# Patient Record
Sex: Male | Born: 1945 | Race: White | Hispanic: No | Smoking: Former smoker
Health system: Southern US, Community
[De-identification: ages and names within clinical notes are randomized; demographics above are authoritative.]

## PROBLEM LIST (undated history)

## (undated) DIAGNOSIS — R7303 Prediabetes: Secondary | ICD-10-CM

## (undated) DIAGNOSIS — F419 Anxiety disorder, unspecified: Secondary | ICD-10-CM

## (undated) DIAGNOSIS — I10 Essential (primary) hypertension: Secondary | ICD-10-CM

## (undated) DIAGNOSIS — C801 Malignant (primary) neoplasm, unspecified: Secondary | ICD-10-CM

## (undated) DIAGNOSIS — G4730 Sleep apnea, unspecified: Secondary | ICD-10-CM

## (undated) DIAGNOSIS — E785 Hyperlipidemia, unspecified: Secondary | ICD-10-CM

## (undated) DIAGNOSIS — F32A Depression, unspecified: Secondary | ICD-10-CM

## (undated) HISTORY — PX: CHOLECYSTECTOMY: SHX55

## (undated) HISTORY — DX: Anxiety disorder, unspecified: F41.9

## (undated) HISTORY — PX: KNEE SURGERY: SHX244

## (undated) HISTORY — PX: EYE SURGERY: SHX253

## (undated) HISTORY — PX: COLON SURGERY: SHX602

## (undated) HISTORY — PX: SHOULDER SURGERY: SHX246

## (undated) HISTORY — DX: Hyperlipidemia, unspecified: E78.5

## (undated) HISTORY — PX: OTHER SURGICAL HISTORY: SHX169

## (undated) HISTORY — DX: Essential (primary) hypertension: I10

---

## 1997-12-08 ENCOUNTER — Ambulatory Visit (HOSPITAL_COMMUNITY): Admission: RE | Admit: 1997-12-08 | Discharge: 1997-12-08 | Payer: Self-pay | Admitting: Gastroenterology

## 1998-01-31 ENCOUNTER — Inpatient Hospital Stay (HOSPITAL_COMMUNITY): Admission: AD | Admit: 1998-01-31 | Discharge: 1998-02-01 | Payer: Self-pay | Admitting: *Deleted

## 1998-06-23 ENCOUNTER — Ambulatory Visit (HOSPITAL_COMMUNITY): Admission: RE | Admit: 1998-06-23 | Discharge: 1998-06-23 | Payer: Self-pay | Admitting: Gastroenterology

## 2000-07-31 ENCOUNTER — Ambulatory Visit (HOSPITAL_COMMUNITY): Admission: RE | Admit: 2000-07-31 | Discharge: 2000-07-31 | Payer: Self-pay | Admitting: Gastroenterology

## 2003-11-26 ENCOUNTER — Emergency Department (HOSPITAL_COMMUNITY): Admission: EM | Admit: 2003-11-26 | Discharge: 2003-11-26 | Payer: Self-pay | Admitting: Emergency Medicine

## 2004-02-02 ENCOUNTER — Ambulatory Visit (HOSPITAL_COMMUNITY): Admission: RE | Admit: 2004-02-02 | Discharge: 2004-02-02 | Payer: Self-pay | Admitting: Gastroenterology

## 2004-02-02 ENCOUNTER — Encounter (INDEPENDENT_AMBULATORY_CARE_PROVIDER_SITE_OTHER): Payer: Self-pay | Admitting: *Deleted

## 2004-03-16 ENCOUNTER — Encounter (INDEPENDENT_AMBULATORY_CARE_PROVIDER_SITE_OTHER): Payer: Self-pay | Admitting: Specialist

## 2004-03-16 ENCOUNTER — Ambulatory Visit (HOSPITAL_COMMUNITY): Admission: RE | Admit: 2004-03-16 | Discharge: 2004-03-16 | Payer: Self-pay | Admitting: Gastroenterology

## 2004-04-27 ENCOUNTER — Encounter (INDEPENDENT_AMBULATORY_CARE_PROVIDER_SITE_OTHER): Payer: Self-pay | Admitting: *Deleted

## 2004-04-27 ENCOUNTER — Ambulatory Visit (HOSPITAL_COMMUNITY): Admission: RE | Admit: 2004-04-27 | Discharge: 2004-04-27 | Payer: Self-pay | Admitting: Gastroenterology

## 2004-04-29 ENCOUNTER — Encounter: Admission: RE | Admit: 2004-04-29 | Discharge: 2004-04-29 | Payer: Self-pay | Admitting: General Surgery

## 2004-06-01 ENCOUNTER — Inpatient Hospital Stay (HOSPITAL_COMMUNITY): Admission: RE | Admit: 2004-06-01 | Discharge: 2004-06-06 | Payer: Self-pay | Admitting: General Surgery

## 2004-06-01 ENCOUNTER — Encounter (INDEPENDENT_AMBULATORY_CARE_PROVIDER_SITE_OTHER): Payer: Self-pay | Admitting: Specialist

## 2004-06-13 ENCOUNTER — Ambulatory Visit: Payer: Self-pay | Admitting: Hematology and Oncology

## 2004-06-16 ENCOUNTER — Ambulatory Visit (HOSPITAL_COMMUNITY): Admission: RE | Admit: 2004-06-16 | Discharge: 2004-06-16 | Payer: Self-pay | Admitting: Hematology and Oncology

## 2005-02-06 ENCOUNTER — Ambulatory Visit: Payer: Self-pay | Admitting: Hematology and Oncology

## 2005-02-20 ENCOUNTER — Ambulatory Visit (HOSPITAL_BASED_OUTPATIENT_CLINIC_OR_DEPARTMENT_OTHER): Admission: RE | Admit: 2005-02-20 | Discharge: 2005-02-20 | Payer: Self-pay | Admitting: Family Medicine

## 2005-03-02 ENCOUNTER — Encounter (INDEPENDENT_AMBULATORY_CARE_PROVIDER_SITE_OTHER): Payer: Self-pay | Admitting: *Deleted

## 2005-03-02 ENCOUNTER — Ambulatory Visit (HOSPITAL_COMMUNITY): Admission: RE | Admit: 2005-03-02 | Discharge: 2005-03-02 | Payer: Self-pay | Admitting: Gastroenterology

## 2005-03-05 ENCOUNTER — Ambulatory Visit: Payer: Self-pay | Admitting: Internal Medicine

## 2005-03-22 ENCOUNTER — Ambulatory Visit (HOSPITAL_BASED_OUTPATIENT_CLINIC_OR_DEPARTMENT_OTHER): Admission: RE | Admit: 2005-03-22 | Discharge: 2005-03-22 | Payer: Self-pay | Admitting: Family Medicine

## 2005-03-26 ENCOUNTER — Ambulatory Visit: Payer: Self-pay | Admitting: Internal Medicine

## 2005-06-23 ENCOUNTER — Encounter (INDEPENDENT_AMBULATORY_CARE_PROVIDER_SITE_OTHER): Payer: Self-pay | Admitting: Specialist

## 2005-06-23 ENCOUNTER — Ambulatory Visit (HOSPITAL_COMMUNITY): Admission: RE | Admit: 2005-06-23 | Discharge: 2005-06-23 | Payer: Self-pay | Admitting: Gastroenterology

## 2005-09-18 ENCOUNTER — Ambulatory Visit: Payer: Self-pay | Admitting: Hematology and Oncology

## 2005-11-02 ENCOUNTER — Encounter: Admission: RE | Admit: 2005-11-02 | Discharge: 2005-11-02 | Payer: Self-pay | Admitting: General Surgery

## 2006-10-12 ENCOUNTER — Encounter: Admission: RE | Admit: 2006-10-12 | Discharge: 2006-10-12 | Payer: Self-pay | Admitting: Orthopaedic Surgery

## 2007-05-17 ENCOUNTER — Ambulatory Visit: Payer: Self-pay | Admitting: Hematology and Oncology

## 2007-05-17 LAB — CBC WITH DIFFERENTIAL/PLATELET
BASO%: 1 % (ref 0.0–2.0)
Basophils Absolute: 0.1 10*3/uL (ref 0.0–0.1)
EOS%: 1.1 % (ref 0.0–7.0)
Eosinophils Absolute: 0.1 10*3/uL (ref 0.0–0.5)
HCT: 47.2 % (ref 38.7–49.9)
HGB: 16.3 g/dL (ref 13.0–17.1)
LYMPH%: 19.6 % (ref 14.0–48.0)
MCH: 30.7 pg (ref 28.0–33.4)
MCHC: 34.6 g/dL (ref 32.0–35.9)
MCV: 88.9 fL (ref 81.6–98.0)
MONO#: 0.5 10*3/uL (ref 0.1–0.9)
MONO%: 6 % (ref 0.0–13.0)
NEUT#: 5.9 10*3/uL (ref 1.5–6.5)
NEUT%: 72.3 % (ref 40.0–75.0)
Platelets: 306 10*3/uL (ref 145–400)
RBC: 5.32 10*6/uL (ref 4.20–5.71)
RDW: 14.3 % (ref 11.2–14.6)
WBC: 8.1 10*3/uL (ref 4.0–10.0)
lymph#: 1.6 10*3/uL (ref 0.9–3.3)

## 2007-05-17 LAB — COMPREHENSIVE METABOLIC PANEL
ALT: 29 U/L (ref 0–53)
AST: 24 U/L (ref 0–37)
Albumin: 4 g/dL (ref 3.5–5.2)
CO2: 32 mEq/L (ref 19–32)
Calcium: 10.4 mg/dL (ref 8.4–10.5)
Chloride: 98 mEq/L (ref 96–112)
Potassium: 3.9 mEq/L (ref 3.5–5.3)
Sodium: 138 mEq/L (ref 135–145)
Total Protein: 7.1 g/dL (ref 6.0–8.3)

## 2007-05-17 LAB — CEA: CEA: <0.5 ng/mL (ref 0.0–5.0)

## 2007-12-11 ENCOUNTER — Ambulatory Visit (HOSPITAL_COMMUNITY): Admission: RE | Admit: 2007-12-11 | Discharge: 2007-12-11 | Payer: Self-pay | Admitting: Hematology and Oncology

## 2008-05-18 ENCOUNTER — Ambulatory Visit: Payer: Self-pay | Admitting: Hematology and Oncology

## 2008-05-20 LAB — CBC WITH DIFFERENTIAL/PLATELET
BASO%: 0.4 % (ref 0.0–2.0)
Basophils Absolute: 0 10*3/uL (ref 0.0–0.1)
EOS%: 2.3 % (ref 0.0–7.0)
Eosinophils Absolute: 0.2 10*3/uL (ref 0.0–0.5)
HCT: 43.7 % (ref 38.7–49.9)
HGB: 15.3 g/dL (ref 13.0–17.1)
LYMPH%: 26.5 % (ref 14.0–48.0)
MCH: 31.6 pg (ref 28.0–33.4)
MCHC: 35.1 g/dL (ref 32.0–35.9)
MCV: 90.1 fL (ref 81.6–98.0)
MONO#: 0.8 10*3/uL (ref 0.1–0.9)
MONO%: 9.9 % (ref 0.0–13.0)
NEUT#: 4.7 10*3/uL (ref 1.5–6.5)
NEUT%: 60.9 % (ref 40.0–75.0)
Platelets: 263 10*3/uL (ref 145–400)
RBC: 4.85 10*6/uL (ref 4.20–5.71)
RDW: 13.2 % (ref 11.2–14.6)
WBC: 7.8 10*3/uL (ref 4.0–10.0)
lymph#: 2.1 10*3/uL (ref 0.9–3.3)

## 2008-05-20 LAB — COMPREHENSIVE METABOLIC PANEL
ALT: 18 U/L (ref 0–53)
AST: 15 U/L (ref 0–37)
Albumin: 4.5 g/dL (ref 3.5–5.2)
Alkaline Phosphatase: 42 U/L (ref 39–117)
BUN: 15 mg/dL (ref 6–23)
CO2: 31 mEq/L (ref 19–32)
Calcium: 9.8 mg/dL (ref 8.4–10.5)
Chloride: 103 mEq/L (ref 96–112)
Creatinine, Ser: 1.02 mg/dL (ref 0.40–1.50)
Glucose, Bld: 73 mg/dL (ref 70–99)
Potassium: 4.1 mEq/L (ref 3.5–5.3)
Sodium: 138 mEq/L (ref 135–145)
Total Bilirubin: 0.4 mg/dL (ref 0.3–1.2)
Total Protein: 6.5 g/dL (ref 6.0–8.3)

## 2008-05-20 LAB — CEA: CEA: 1 ng/mL (ref 0.0–5.0)

## 2008-06-18 ENCOUNTER — Ambulatory Visit (HOSPITAL_COMMUNITY): Admission: RE | Admit: 2008-06-18 | Discharge: 2008-06-18 | Payer: Self-pay | Admitting: Hematology and Oncology

## 2008-07-02 ENCOUNTER — Encounter: Admission: RE | Admit: 2008-07-02 | Discharge: 2008-07-02 | Payer: Self-pay | Admitting: Family Medicine

## 2009-05-19 ENCOUNTER — Ambulatory Visit: Payer: Self-pay | Admitting: Hematology and Oncology

## 2009-05-21 ENCOUNTER — Ambulatory Visit (HOSPITAL_COMMUNITY): Admission: RE | Admit: 2009-05-21 | Discharge: 2009-05-21 | Payer: Self-pay | Admitting: Hematology and Oncology

## 2009-05-21 LAB — CBC WITH DIFFERENTIAL/PLATELET
BASO%: 0.7 % (ref 0.0–2.0)
Basophils Absolute: 0.1 10*3/uL (ref 0.0–0.1)
EOS%: 2.5 % (ref 0.0–7.0)
Eosinophils Absolute: 0.2 10*3/uL (ref 0.0–0.5)
HCT: 42.1 % (ref 38.4–49.9)
HGB: 14.7 g/dL (ref 13.0–17.1)
LYMPH%: 24.2 % (ref 14.0–49.0)
MCH: 30.4 pg (ref 27.2–33.4)
MCHC: 34.9 g/dL (ref 32.0–36.0)
MCV: 87.2 fL (ref 79.3–98.0)
MONO#: 0.9 10*3/uL (ref 0.1–0.9)
MONO%: 12 % (ref 0.0–14.0)
NEUT#: 4.5 10*3/uL (ref 1.5–6.5)
NEUT%: 60.6 % (ref 39.0–75.0)
Platelets: 247 10*3/uL (ref 140–400)
RBC: 4.83 10*6/uL (ref 4.20–5.82)
RDW: 13.6 % (ref 11.0–14.6)
WBC: 7.5 10*3/uL (ref 4.0–10.3)
lymph#: 1.8 10*3/uL (ref 0.9–3.3)

## 2009-05-21 LAB — COMPREHENSIVE METABOLIC PANEL
ALT: 34 U/L (ref 0–53)
AST: 22 U/L (ref 0–37)
Albumin: 4.5 g/dL (ref 3.5–5.2)
Alkaline Phosphatase: 53 U/L (ref 39–117)
BUN: 21 mg/dL (ref 6–23)
CO2: 22 mEq/L (ref 19–32)
Calcium: 9.2 mg/dL (ref 8.4–10.5)
Chloride: 104 mEq/L (ref 96–112)
Creatinine, Ser: 0.95 mg/dL (ref 0.40–1.50)
Glucose, Bld: 119 mg/dL — ABNORMAL HIGH (ref 70–99)
Potassium: 4 mEq/L (ref 3.5–5.3)
Sodium: 139 mEq/L (ref 135–145)
Total Bilirubin: 0.4 mg/dL (ref 0.3–1.2)
Total Protein: 6.8 g/dL (ref 6.0–8.3)

## 2009-05-21 LAB — CEA: CEA: 0.9 ng/mL (ref 0.0–5.0)

## 2010-05-29 ENCOUNTER — Encounter: Payer: Self-pay | Admitting: Hematology and Oncology

## 2010-05-29 ENCOUNTER — Encounter: Payer: Self-pay | Admitting: Family Medicine

## 2010-05-30 ENCOUNTER — Encounter: Payer: Self-pay | Admitting: Hematology and Oncology

## 2010-09-23 NOTE — Op Note (Signed)
[name], [name]                ACCOUNT NO.:  [id number]   MEDICAL RECORD NO.:  [id number]          PATIENT TYPE:  AMB   LOCATION:  ENDO                         FACILITY:  [facility]   PHYSICIAN:  John C. Madilyn Fireman, M.D.    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  Procedure is for surveillance of his neoplasm.   PROCEDURE:  The patient was placed in the left lateral decubitus position  and placed on the pulse monitor with continuous low-flow oxygen delivered by  nasal cannula. He was sedated with 80 mcg IV fentanyl, 9 milligrams IV  Versed. Olympus video colonoscope was inserted into the rectum, advanced to  cecum, confirmed by transillumination of McBurney's point and visualization  of ileocecal valve and appendiceal orifice. Prep was good. The cecum and  ascending colon appeared normal. In what was estimated to be the proximal  transverse colon there was an oblong approximately 2.5 x 1.5 cm semisessile  polyp that was slightly tethered and on a haustral fold.  Two or three  biopsies of this and further inspection felt that it could possibly be  removable endoscopically. We used the snare to cut through the lesion in two  fairly large fragments. It was not clear whether all residual polyp was  removed but all the biopsy specimens were sent in the same specimen  container to rule out malignancy. The remainder of the transverse colon  appeared normal. There was a fairly complex-looking surgical anastomosis at  approximately 60 cm with suture material seen and no evidence of neoplasia  or visible granulation tissue. The descending, sigmoid, and rectum distal to  this appeared normal with exception of few scattered sigmoid diverticula.  Scope was then withdrawn and the  patient returned to the recovery room in  stable condition. He tolerated the procedure well.  There were no immediate  complications.   IMPRESSION:  1.  Transverse colon polypoid mass.  2.  Surgical anastomosis of approximately 60 cm.   PLAN:  Await histology.  It is unclear whether this lesion will require  additional surgery or may be amenable to endoscopic obliteration but in the  latter case certainly need close endoscopic followup.           ______________________________  Everardo All. Madilyn Fireman, M.D.     JCH/MEDQ  D:  03/02/2005  T:  03/02/2005  Job:  [id number]   cc:   Dr. Barrie Lyme in oncology   Donia Guiles, M.D.  Fax: [phone]   Ollen Gross. Vernell Morgans, M.D.  [address], [address]  [place]  [place] [address]

## 2010-09-23 NOTE — Op Note (Signed)
[name], [name]                ACCOUNT NO.:  [id number]   MEDICAL RECORD NO.:  [id number]          PATIENT TYPE:  AMB   LOCATION:  ENDO                         FACILITY:  [facility]   PHYSICIAN:  John C. Madilyn Fireman, M.D.    DATE OF BIRTH:  April 05, 1946   DATE OF PROCEDURE:  02/02/2004  DATE OF DISCHARGE:                                 OPERATIVE REPORT   PROCEDURE:  Colonoscopy with polypectomy.   INDICATIONS FOR PROCEDURE:  Multiple colon polyps in 2000 and in 2002.   PROCEDURE:  The patient was placed in the left lateral decubitus position  and placed on the pulse monitor with continuous low flow oxygen delivered by  nasal cannula.  He was sedated with 100 mcg IV fentanyl and 10 mg IV Versed.  The Olympus video colonoscope is inserted into the rectum and advanced to  the cecum, confirmed by transillumination at McBurney's point and  visualization of the ileocecal valve and appendiceal orifice.  Prep was  fairly good but somewhat limited in some areas.  I could not rule out small  lesions less than 1 cm in all areas.  Otherwise, the cecum appeared normal.  In what was estimated to be the distal transverse colon, there was a fairly  large sessile polyp that I attempted to ensnare, but the base appeared to be  too thick for safe endoscopic removal.  I snared about the distal half of it  off and sent it for histology.  Within the transverse colon, there was an  odd-shaped, serpiginous polyp that extended in 2-3 directions, that I did  not feel could be safely ensnared, and took two biopsies of it and sent in a  separate specimen container.  There were diverticula noted in the sigmoid as  well.  Within the descending colon, there was an area of erythema and  granularity that I initially thought might represent sessile polyp, but on  closer inspection, it appeared to be more likely inflammatory.  There were 2-  3 diverticula around it.  I palpated it with the closed biopsy forceps, and  it felt  somewhat firm, so I biopsied it, and this resulted in extrusion of  white pus, presumably representing diverticulitis.  A biopsy of the  overlying mucosa was sent for histology.  There were scattered diverticula  seen throughout the remainder of the descending and sigmoid colon but no  polyps.  There was an 8 mm polyp in the rectum of 20 cm that was fulgurated  by hot biopsy, and it was sent in the second specimen container.  The  remainder of the rectum appeared normal.  The scope was then withdrawn, and  the patient returned to the recovery room in stable condition.  He tolerated  the procedure well, and there were no immediate complications.   IMPRESSION:  1.  Two polypoid masses in the ascending and transverse colon respectively,      await biopsies to rule out malignancy.  2.  Apparent diverticulitis, based on pus extruded after biopsy of inflamed-      appearing descending colon tissue near diverticula.  PLAN:  I doubt the residual neoplastic tissue can be safely removed  endoscopically and suspect he will probably need a right hemicolectomy,  making sure that the distal margin encompasses the lesion that is presumably  in the transverse colon.  Will await biopsy results and discuss options with  the patient.  Will also treat empirically for diverticulitis with Cipro and  Flagyl.      JCH/MEDQ  D:  02/02/2004  T:  02/02/2004  Job:  [id number]   cc:   Donia Guiles, M.D.  [address] [place]  [place] [address]  Fax: [phone]

## 2010-09-23 NOTE — Discharge Summary (Signed)
NAMEKYLEY, LAUREL                ACCOUNT NO.:  [id number]   MEDICAL RECORD NO.:  [id number]          PATIENT TYPE:  INP   LOCATION:  0363                         FACILITY:  [facility]   PHYSICIAN:  Ollen Gross. Vernell Morgans, M.D. DATE OF BIRTH:  Apr 07, 1946   DATE OF ADMISSION:  06/01/2004  DATE OF DISCHARGE:  06/06/2004                                 DISCHARGE SUMMARY   Mr. 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  He was brought to the operating room on  June 01, 2004 and underwent a laparoscopic assisted resection of the  splenic flexure of his colon. He tolerated this well.  On postoperative day  1 his NG tube was discontinued and he was started on ice chips and Reglan.  He was up ambulating.  He continued to improve over the next few days and on  June 06, 2004 he was ready for discharge home.   DISCHARGE MEDICATIONS:  He was to resume his home medications and he was  given a prescription for Vicodin for pain.   ACTIVITY:  No heavy lifting.   DIET:  As tolerated.   CONDITION ON DISCHARGE:  Stable.  He is discharged home.   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   FOLLOW UP:  With Dr. Carolynne Edouard in the next week.      PST/MEDQ  D:  07/13/2004  T:  07/14/2004  Job:  [id number]

## 2010-09-23 NOTE — Op Note (Signed)
[name], [name]                ACCOUNT NO.:  [id number]   MEDICAL RECORD NO.:  [id number]          PATIENT TYPE:  INP   LOCATION:  0363                         [facility]ACILITY:  [facility]   PHYSICIAN:  Ollen Gross. Vernell Morgans, M.D. 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   PROCEDURE:  Laparoscopic-assisted resection of the splenic flexure of the  colon.   SURGEON:  Ollen Gross. Carolynne Edouard, M.D.   ASSISTANT:  Currie Paris, M.D.   ANESTHESIA:  General endotracheal.   PROCEDURE:  After informed consent was obtained, the patient was brought to  the operating room and placed in the supine position on the operating table.  After adequate induction of general anesthesia, the patient's abdomen was  prepped with Betadine and draped in the usual sterile manner.  The area  above the umbilicus was infiltrated with 0.25% Marcaine.  A small vertically  oriented incision was made with a 15 blade knife.  This incision was carried  down through the subcutaneous tissue bluntly with a Kelly clamp and [employer], water.  The patient did have a hernia defect in this area.  The  hernia sac was identified and opened sharply with the electrocautery.  The  hernia sac appeared to only contain some omentum, and this was all separated  from the hernia sac sharply with the electrocautery and reduced.  The hernia  sac itself was excised sharply with the electrocautery.  A 0 Vicryl  pursestring stitch was placed in the fascia surrounding the opening.  A  Hasson cannula was placed through the opening and anchored in placed with 0  Vicryl pursestring stitch.  The abdomen was then insufflated with carbon  dioxide without difficulty.  In the epigastric area, another site was  chosen  for a 10 mm port.  This area was infiltrated with 0.25% Marcaine.  A small  incision was made with a 15 blade knife, and a 10 mm port was placed bluntly  through this incision into the abdominal cavity under direct vision.  Another site was chosen laterally on the left side of the abdomen, with  placement of another 10 mm port.  This area was infiltrated with 0.25%  Marcaine.  A small incision was made with a 15 blade knife, and a 10 mm port  was placed bluntly through the incision into the abdominal cavity under  direct vision.  Initially, the left colon was mobilized by incising the  retroperitoneal attachment along the white line of Toldt using the Harmonic  scalpel.  The splenic flexure of the colon was very deep and close to the  spleen, and it was not easy to see at this point.  Because of this, the  supraumbilical incision was extended enough to place a laparoscopic disc  port through the abdominal wall.  Once this was accomplished, I was able to  place a hand assist through the lap disc, and in doing this we were able to  gently mobilize the splenic flexure away from the spleen using a combination  of blunt finger dissection and sharp dissection with the Harmonic scalpel.  We were also able to further mobilize the left colon, again by a combination  of blunt finger dissection and sharp dissection with the Harmonic scalpel.  There was an area of tattooing dye that we were able to visualize up at the  splenic flexure of the colon to confirm that this was the area in question  that needed to be resected.  The transverse colon was also mobilized by  separating it from the omentum, again using sharp dissection with the  Harmonic scalpel.  Once this was accomplished, the left colon and splenic  flexure were quite mobile.  Attempts were made to bring the colon up through  the lap disc which were unsuccessful.  The colon still was just not mobile  enough to reach.  At this point,  the lap disc was removed, and the incision  was extended superiorly so that the rest of the colon could be reached.  The  incision was opened sharply with a 10 blade knife and Bovie electrocautery.  Once this was accomplished, visualization was much better.  We were able to  mobilize the rest of the splenic flexure of the colon so that it could  easily reach up into the wound.  A site was chosen proximal to the tattooed  area along the transverse colon for dividing the colon.  The mesentery at  this point was opened with the electrocautery.  A GI75 stapler was placed  across the bowel at this point, clamped, and fired, thereby dividing the  bowel between staple lines.  A site distal to the tattooed area was also  chosen so as to give a good margin.  The mesentery at this point of the  colon was opened sharply with the electrocautery.  A GI75 stapler was placed  across the colon at this point, clamped, and fired, thereby dividing the  bowel between staple lines.  The mesentery to the isolated segment of colon  was then taken down by serially clamping each of the vessels in the  mesentery with Kelly clamps as far down the mesentery as we could, dividing  with Metzenbaum scissors and then ligating with 2-0 silk ties.  Once this  was accomplished, there were several small bleeding points near the base of  the mesentery, and these were controlled with interrupted 3-0 silk stitches.  Once this was accomplished, the area was hemostatic.  The proximal and  distal segments of colon would easily reach each other.  The antimesenteric  borders of each of these segments of colon were approximated with  interrupted 3-0 silk stitches.  Small enterotomies were made on the  antimesenteric border of each of the segments of colon, and each limb of a  GIA stapler was placed down the according limb of bowel, clamped, and fired,  thereby creating a nice widely patent enteroenterostomy.  The enterotomy was then  closed with interrupted 3-0 silk stitches, full thickness, and the  mesenteric defect was closed with figure-of-eight 3-0 silk and 2-0 silk  stitches.  A single interrupted 3-0 silk crotch stitch was placed at the  proximal end of the anastomosis.  The anastomosis was easily palpable and  widely patent and appeared to be healthy.  The abdomen was then irrigated  with copious  amounts of saline.  The area was examined again and found to be  hemostatic along the left colonic gutter area.  The rest of the abdomen was  inspected, and no other abnormalities were noted.  The fascia of the  anterior abdominal wall was then closed with two running #1 PDS sutures.  The subcutaneous tissue was irrigated with copious amounts of saline and  Betadine, and the skin was closed with staples.  Sterile dressings were  applied.  The patient tolerated the procedure well.  At the end of the case,  all needle, sponge, and instrument counts were correct.  The patient was  then awakened and taken to the recovery room in stable condition.      PST/MEDQ  D:  06/07/2004  T:  06/07/2004  Job:  [id number]

## 2010-09-23 NOTE — Op Note (Signed)
[name], [name]                ACCOUNT NO.:  [id number]   MEDICAL RECORD NO.:  [id number]          PATIENT TYPE:  AMB   LOCATION:  ENDO                         FACILITY:  [facility]   PHYSICIAN:  John C. Madilyn Fireman, M.D.    DATE OF BIRTH:  17-Sep-1945   DATE OF PROCEDURE:  04/27/2004  DATE OF DISCHARGE:                                 OPERATIVE REPORT   PROCEDURE:  Colonoscopy with polypectomy and tumor localization.   INDICATIONS FOR PROCEDURE:  Sessile polypoid mass near the splenic flexure  which is not felt to be resectable endoscopically and at significant risk  for harboring or developing malignancy.  He is scheduled for surgery and the  procedure is for [place] ink localization of the polypoid mass for accurate  resection.   DESCRIPTION OF PROCEDURE:  The patient was placed in the left lateral  decubitus position then placed on the pulse monitor with continuous low flow  oxygen delivered by nasal cannula.  He was sedated with 75 mcg IV fentanyl  and 7 mg IV Versed. The Olympus video colonoscope was inserted into the  rectum and advanced to the cecum, confirmed by transillumination at  McBurney's point and visualization of the ileocecal valve and appendiceal  orifice.  The prep was fairly good.  The cecum appeared normal with no  masses, polyps, diverticula or other mucosal abnormalities.  Within the  ascending colon, there was a small round sessile polyp approximately 6-8 mm  that was removed by snare.  There were two other areas where I could see  previous polypectomy sites with no visible polyp remaining.  The remainder  of the ascending and most of the transverse colon appeared normal with no  further polyps or masses.  As before, somewhere around 60-70 cm estimated to  be in the distal, transverse or proximal descending colon, there was a  somewhat vaguely delineated polypoid mass that was sessile and firm but not  hard.  I injected the [place] ink in three places around the  circumference of  this lesion which did not look significantly different than on previous  studies.  In addition, in the descending colon at approximately 55 cm was  another smaller sessile polyp approximately 8 mm in diameter and this was  removed by snare and sent in a separate specimen container. There were  scattered diverticula within the descending and sigmoid colon without any  visible suggestion of diverticulitis as had been seen on one occasion  before.  The rectum appeared normal and retroflexed view of the anus  revealed small to moderate internal hemorrhoids. The scope was then  withdrawn and the patient returned to the recovery room in stable condition.  He tolerated the procedure well and there were no immediate complications.   IMPRESSION:  1.  Sessile polypoid mass near the splenic flexure as seen before, marked      with [place] ink for upcoming resection.  2.  Small ascending and descending colon polyps.  3.  Diverticulosis.   PLAN:  1.  Proceed with surgery per Dr. Luan Pulling presumably a left hemicolectomy  to include the area of previous diverticulitis in the descending colon.  2.  Repeat colonoscopy in 1-3 years depending on histologic finding at the      time of surgery.      JCH/MEDQ  D:  04/27/2004  T:  04/27/2004  Job:  [id number]   cc:   Donia Guiles, M.D.  [address] [place]  [place] [address]  Fax: [phone]   Vikki Ports, MD  [phone], [address]  [place]  [place] [address]  Fax: [phone]

## 2010-09-23 NOTE — Op Note (Signed)
[name], [name]                ACCOUNT NO.:  [id number]   MEDICAL RECORD NO.:  [id number]          PATIENT TYPE:  AMB   LOCATION:  ENDO                         FACILITY:  [facility]   PHYSICIAN:  John C. Madilyn Fireman, M.D.    DATE OF BIRTH:  1946-03-18   DATE OF PROCEDURE:  03/16/2004  DATE OF DISCHARGE:                                 OPERATIVE REPORT   PROCEDURE:  Colonoscopy with ERBE laser coagulation and biopsy.   INDICATIONS FOR PROCEDURE:  History of large, difficult-to-remove polyps  seen on previous colonoscopy, two of them fulgurated but with probable  residual polyp remaining.  A third polyp was simply biopsied.  None of the  lesions came back cancerous, and after discussion with the patient of  surgery versus repeat colonoscopy, we decided to make another attempt to try  to definitely fulgurate the remaining lesions.   PROCEDURE:  The patient was placed in the left lateral decubitus position  and placed on the pulse monitor with continuous low flow oxygen delivered by  nasal cannula.  He was sedated with 100 mcg IV fentanyl and 10 mg IV Versed.  The Olympus video colonoscope was inserted into the rectum and advanced to  the cecum, confirmed by transillumination at McBurney's point and  visualization of the ileocecal valve and appendiceal orifice.  Prep is good.  The colon was long, and the cecum was difficult to reach due to the  patient's large body habitus.  The cecum appeared normal with no masses,  polyps, diverticula, or other mucosal abnormalities.  In the ascending  colon, there were two areas which appeared to be consistent with residual  polyp from previous fulguration with some whitish scar tissue in the  vicinity.  Both of these lesions were somewhat subtle in appearance, and it  was difficult to tell the delineation between the remaining polyps and the  surrounding mucosa.  I applied the ERBE laser argon plasma coagulator to  both these lesions, and it seemed to  result in visual obliteration of any  remaining polypoid elements.  The remainder of the ascending and transverse  colon appeared normal.  At 60 cm, in what was felt to be the descending  colon or possibly distal transverse colon, there was an irregularly  irregular contoured, slightly raised polypoid mass with appearance typical  of advanced adenoma versus early carcinoma.  It appeared to wrap around one  fold, and I had Dr. Leone Payor view this lesion with me.  We decided the  likelihood is low that this would be ultimately able to be fulgurated  completely endoscopically, and in doing so would take, at best, probably  three sessions or more.  I decided to aggressively biopsy the lesion, and  given the multiple diverticula surrounding it and previous evidence of  diverticulitis on previous colonoscopy, the best course of treatment would  probably be a left hemicolectomy for this patient.  There were multiple  diverticula in the descending and sigmoid colon with no endoscopic evidence  of diverticulitis.  The rectum appeared normal, and retroflexed view of the  anus revealed no obvious  internal hemorrhoids.  The scope was then  withdrawn, and the patient returned to the recovery room in stable  condition.  He tolerated the procedure well, and there were no immediate  complications.   IMPRESSION:  1.  Persistent ascending colon polyps, fulgurated with ERBE argon plasma      coagulator.  2.  Polypoid mass in the proximal descending colon/splenic flexure vicinity.      Doubt removal endoscopically.  Status post biopsies.   PLAN:  Await biopsy results but I will advise the patient to pursue left  hemicolectomy and will probably arrange this in the near future.      JCH/MEDQ  D:  03/16/2004  T:  03/16/2004  Job:  [id number]   cc:   Donia Guiles, M.D.  [address] [place]  [place] [address]  Fax: [phone]

## 2010-09-23 NOTE — Op Note (Signed)
[name], [name]                ACCOUNT NO.:  [id number]   MEDICAL RECORD NO.:  [id number]          PATIENT TYPE:  AMB   LOCATION:  ENDO                         FACILITY:  [facility]   PHYSICIAN:  John C. Madilyn Fireman, M.D.    DATE OF BIRTH:  [birth year]   DATE OF PROCEDURE:  06/23/2005  DATE OF DISCHARGE:                                 OPERATIVE REPORT   PROCEDURE:  Colonoscopy with polypectomy.   INDICATIONS FOR PROCEDURE:  History of colon cancer requiring left  hemicolectomy, with follow-up colonoscopy showing a sessile 2.5 x 1.5 cm  polyp 4 months ago that was resected but not felt to be completely removed.  Procedure is for followup and hopefully clearance of the colon of any  remaining adenoma.   PROCEDURE:  The patient was placed in the left lateral decubitus position  and placed on the pulse monitor with continuous low-flow oxygen delivered by  nasal cannula. He was sedated with 50 mcg IV fentanyl and 6 mg IV Versed.  The Olympus video colonoscope was inserted into the rectum and advanced to  the cecum, confirmed by transillumination of McBurney's point visualization  of the ileocecal valve and appendiceal orifice. The prep was excellent. The  cecum and ascending colon appeared normal, with no masses, polyps,  diverticula or other mucosal abnormalities. In the transverse colon, there  was a 6 mm polyp and a 4 mm polyp that were removed by hot biopsy.  A  somewhat larger, slightly irregular 8 mm polyp was removed by snare. This  was felt probably to represent the previous polyp, although this could not  be ascertained for certain. The remainder of the transverse colon appeared  normal. As before, there was a complex-appearing surgical anastomoses about  60 cm which appeared to be intact. The mucosa distal to this all the way  down to the rectum appeared normal, with no further polyps, masses,  diverticula, or other mucosal abnormalities. The scope was then withdrawn  and the patient  returned to the recovery room in stable condition. He  tolerated the procedure well, and there were no immediate complications.   IMPRESSION:  Three transverse colon polyps.   PLAN:  Await histology. Will probably repeat the study in two years.           ______________________________  Everardo All Madilyn Fireman, M.D.     JCH/MEDQ  D:  06/23/2005  T:  06/23/2005  Job:  [id number]   cc:   Ollen Gross. Vernell Morgans, M.D.  [address]  [place]  [place] [address]   Donia Guiles, M.D.  Fax: [phone]   Lauretta I. Odogwu, M.D.  Fax: [phone]

## 2010-09-23 NOTE — Procedures (Signed)
[name], [name]                ACCOUNT NO.:  [id number]   MEDICAL RECORD NO.:  [id number]          PATIENT TYPE:  OUT   LOCATION:  SLEEP CENTER                 FACILITY:  [facility]   PHYSICIAN:  Clinton D. Maple Hudson, M.D. DATE OF BIRTH:  May 22, 1945   DATE OF STUDY:                              NOCTURNAL POLYSOMNOGRAM   REFERRING PHYSICIAN:  Dr. Donia Guiles.   DATE OF STUDY:  March 22, 2005.   INDICATION FOR STUDY:  Hypersomnia with sleep apnea.   EPWORTH SLEEPINESS SCORE:  10/24.   BMI:  42.3.   WEIGHT:  270 pounds.   A baseline diagnostic NPSG on February 20, 2005 reported an AHI of 85.1 per  hour. C-PAP titration is requested.   SLEEP ARCHITECTURE:  Total sleep time 412 minutes with sleep efficiency 92%.  Stage I 3%, stage II 72%, stages III and IV 1%, REM 24% of total sleep time.  Sleep latency 27 minutes, REM latency 70 minutes, awake after sleep onset 11  minutes, arousal index 30.7. Amitriptyline was taken before arrival at the  sleep center as a sleep aid.   RESPIRATORY DATA:  C-PAP titration protocol. C-PAP was successfully titrated  to 16 CWP, AHI 3.9 per hour. A large Respironics comfort Lite II nasal mask  was used with chin strap and heated  humidifier.   OXYGEN DATA:  Snoring was prevented at final C-PAP with oxygen maintained 95-  98% on room air.   CARDIAC DATA:  Sinus rhythm with occasional PAC.   MOVEMENT/PARASOMNIA:  A total of 159 limb jerks were reported of which 10  were associated with arousal or awakening for periodic limb movement with  arousal index of 1.5 per hour which is probably insignificant.   IMPRESSION/RECOMMENDATIONS:  1.  Successful C-PAP titration to 16 CWP, AHI 3.9 per hour. A large      Respironics comfort Lite II nasal mask was used with chin strap and      heated humidifier.  2.  Baseline diagnostic NPSG and February 20, 2005 had reported an AHI of      85.1 per hour.  3.  Limb jerks were noted at a frequency which is unlikely  to be significant      especially on the C-PAP titration      night when sleep is somewhat disturbed. Recognize that amitriptyline may      increase the incidence of limb jerk activity if it becomes a clinical      problem.      Clinton D. Maple Hudson, M.D.  Diplomate, Biomedical engineer of Sleep Medicine  Electronically Signed     CDY/MEDQ  D:  03/26/2005 12:25:45  T:  03/27/2005 00:19:09  Job:  [id number]

## 2010-11-02 ENCOUNTER — Other Ambulatory Visit: Payer: Self-pay | Admitting: Gastroenterology

## 2011-02-03 LAB — BUN: BUN: 11

## 2011-02-03 LAB — CREATININE, SERUM: GFR calc non Af Amer: >60

## 2011-05-26 ENCOUNTER — Other Ambulatory Visit: Payer: Self-pay | Admitting: Family Medicine

## 2011-06-08 ENCOUNTER — Ambulatory Visit
Admission: RE | Admit: 2011-06-08 | Discharge: 2011-06-08 | Disposition: A | Discharge status: Home / Self Care | Payer: 59 | Source: Ambulatory Visit | Attending: Family Medicine | Admitting: Family Medicine

## 2011-12-25 ENCOUNTER — Other Ambulatory Visit: Payer: Self-pay | Admitting: Gastroenterology

## 2012-08-22 ENCOUNTER — Ambulatory Visit
Admission: RE | Admit: 2012-08-22 | Discharge: 2012-08-22 | Disposition: A | Discharge status: Home / Self Care | Payer: 59 | Source: Ambulatory Visit | Attending: Family Medicine | Admitting: Family Medicine

## 2012-08-22 ENCOUNTER — Other Ambulatory Visit: Payer: Self-pay | Admitting: Family Medicine

## 2012-08-22 DIAGNOSIS — R05 Cough: Secondary | ICD-10-CM

## 2014-05-04 ENCOUNTER — Encounter: Payer: Self-pay | Admitting: Podiatry

## 2014-05-04 ENCOUNTER — Ambulatory Visit (INDEPENDENT_AMBULATORY_CARE_PROVIDER_SITE_OTHER): Payer: Medicare Other

## 2014-05-04 ENCOUNTER — Ambulatory Visit (INDEPENDENT_AMBULATORY_CARE_PROVIDER_SITE_OTHER): Payer: Medicare Other | Admitting: Podiatry

## 2014-05-04 VITALS — BP 161/76 | HR 61 | Resp 15 | Ht 67.0 in | Wt 256.0 lb

## 2014-05-04 DIAGNOSIS — M722 Plantar fascial fibromatosis: Secondary | ICD-10-CM

## 2014-05-04 DIAGNOSIS — M79672 Pain in left foot: Secondary | ICD-10-CM

## 2014-05-04 DIAGNOSIS — M8430XA Stress fracture, unspecified site, initial encounter for fracture: Secondary | ICD-10-CM

## 2014-05-04 NOTE — Progress Notes (Signed)
   Subjective:    Patient ID: [name], male    DOB: 10/15/1945, 68 y.o.   MRN: [id number]  HPI Comments: N pain in heel L left inferior arch and medial heel D 10 days O after wearing flat leather shoes and then sitting several hours in a recliner C pain and aching, swelling medial heel A weight bearing T lotion and 2 pairs of socks for cushion     Review of Systems  HENT:       CPAP for sleep.  All other systems reviewed and are negative.      Objective:   Physical Exam  Orientated 3 patient presents with significant other  Vascular: DP pulses 2/4 bilaterally PT pulses 2/4 bilaterally  Neurological: Ankle reflex equal and reactive bilaterally Vibratory sensation intact bilaterally Sensation to 10 g monofilament wire intact 5/5 bilaterally  Dermatological: The toenails are brittle, hypertrophic and discolored 6-10  Musculoskeletal: Patient is limping gait favoring left foot Palpable tenderness medial plantar fascial area left Palpable tenderness to medial lateral compression on left calcaneus There is no palpable tenderness to medial lateral compression on right calcaneus  There is no restriction ankle, subtalar, midtarsal joints  X-ray examination left foot Pes planus Posterior and inferior heel spurs Metatarsus adductus Surgical resection head ofproximal phalanx fifth toe         Assessment & Plan:   Assessment: Plantar fasciitis left Bone stress reaction left calcaneus  Plan: Dispensed Cam Walker boot to wear on left foot/lower leg Stretching exercises prescribed Limit standing and walking  Reappoint 3 weeks

## 2014-05-04 NOTE — Patient Instructions (Signed)
Wear boot on left foot except when sleeping and showering Remove boot several times a day in a seated position and move foot up and down at the ankle for 2-3 minutes Using a towel in a seated position pulled the toes toward your nose several times a day 1-2 minutes Limit the amount of standing and walking

## 2014-05-05 ENCOUNTER — Encounter: Payer: Self-pay | Admitting: Podiatry

## 2014-05-25 ENCOUNTER — Ambulatory Visit: Payer: Medicare Other | Admitting: Podiatry

## 2014-05-27 DIAGNOSIS — Z961 Presence of intraocular lens: Secondary | ICD-10-CM | POA: Insufficient documentation

## 2014-05-27 DIAGNOSIS — Z9889 Other specified postprocedural states: Secondary | ICD-10-CM | POA: Insufficient documentation

## 2014-05-27 DIAGNOSIS — H26491 Other secondary cataract, right eye: Secondary | ICD-10-CM | POA: Insufficient documentation

## 2014-05-27 DIAGNOSIS — H338 Other retinal detachments: Secondary | ICD-10-CM | POA: Insufficient documentation

## 2015-03-17 ENCOUNTER — Other Ambulatory Visit: Payer: Self-pay | Admitting: Gastroenterology

## 2016-04-03 ENCOUNTER — Ambulatory Visit (INDEPENDENT_AMBULATORY_CARE_PROVIDER_SITE_OTHER): Payer: Medicare Other

## 2016-04-03 ENCOUNTER — Ambulatory Visit (INDEPENDENT_AMBULATORY_CARE_PROVIDER_SITE_OTHER): Payer: Medicare Other | Admitting: Orthopaedic Surgery

## 2016-04-03 ENCOUNTER — Encounter (INDEPENDENT_AMBULATORY_CARE_PROVIDER_SITE_OTHER): Payer: Self-pay | Admitting: Orthopaedic Surgery

## 2016-04-03 VITALS — BP 130/78 | HR 68 | Resp 14 | Ht 67.0 in | Wt 256.0 lb

## 2016-04-03 DIAGNOSIS — M25562 Pain in left knee: Secondary | ICD-10-CM | POA: Diagnosis not present

## 2016-04-03 NOTE — Progress Notes (Signed)
Office Visit Note   Patient: [name]           Date of Birth: [birth year]           MRN: [id number] Visit Date: 04/03/2016              Requested by: Mayra Neer, MD [address] [place], [place] [address] PCP: Mayra Neer, MD   Assessment & Plan: Visit Diagnoses: No diagnosis found. I think [name] has exacerbated the arthritis in his left knee. We will apply a knee support and see him back in 4-6 weeks for consideration of a cortisone injection.  Plan: f/u in 4-6 weeks if no improvement in left knee pain  Follow-Up Instructions: No Follow-up on file.   Orders:  No orders of the defined types were placed in this encounter.  No orders of the defined types were placed in this encounter.     Procedures: No procedures performed   Clinical Data: No additional findings.   Subjective: No chief complaint on file.   Pt fell off a ladder on Thursday 03/30/2016 getting Christmas lights up. He "hyper extended" it and next day very painful, could not walk.  Pt has multiple abrasions on knees, hands, shins  Swollen  Pt borrowed a brace from a friend and it helped him   [name] relates that he actually is feeling better today than he was even over the weekend. He is able to walk more efficiently. There's been no ecchymosis. He does feel  That his  left knee is tight. Review of Systems  Constitutional: Negative.   HENT: Negative.   Eyes: Negative.        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        CPAP  Cardiovascular: Negative.   Gastrointestinal: Negative.   Endocrine: Negative.   Genitourinary: Negative.   Musculoskeletal: Negative.   Skin: Negative.   Allergic/Immunologic: Negative.   Neurological: Negative.   Hematological: Negative.   Psychiatric/Behavioral: Negative.      Objective: Vital Signs: There were no vitals taken for this visit.  Physical Exam  Ortho Exam left knee exam demonstrates  a very small effusion. He does have more medial than lateral joint pain. There is full extension and flexion over 105 without evidence of instability. There is some pain just proximal to the patella but without a gap. He has good strength with knee extension. There is no calf pain or lower extremity swelling. Neurovascular exam is intact. He denies any pain with straight leg raise but with range of motion of his left hip  Specialty Comments:  No specialty comments available.  Imaging: No results found.   PMFS History: There are no active problems to display for this patient.  Past Medical History:  Diagnosis Date  . Anxiety   . Hyperlipidemia   . Hypertension     No family history on file.  Past Surgical History:  Procedure Laterality Date  . bicep surgery Bilateral   . CHOLECYSTECTOMY    . COLON SURGERY    . EYE SURGERY    . KNEE SURGERY Left   . SHOULDER SURGERY Left    Social History   Occupational History  . Not on file.   Social History Main Topics  . Smoking status: Current Some Day Smoker  . Smokeless tobacco: Not on file  . Alcohol use Not on file  . Drug use: Unknown  . Sexual activity: Not on  file

## 2016-07-07 ENCOUNTER — Ambulatory Visit (INDEPENDENT_AMBULATORY_CARE_PROVIDER_SITE_OTHER): Payer: Medicare Other | Admitting: Orthopaedic Surgery

## 2016-07-07 ENCOUNTER — Encounter (INDEPENDENT_AMBULATORY_CARE_PROVIDER_SITE_OTHER): Payer: Self-pay | Admitting: Orthopaedic Surgery

## 2016-07-07 VITALS — BP 136/73 | HR 76 | Resp 14 | Ht 67.0 in | Wt 238.0 lb

## 2016-07-07 DIAGNOSIS — M25562 Pain in left knee: Secondary | ICD-10-CM | POA: Diagnosis not present

## 2016-07-07 DIAGNOSIS — G8929 Other chronic pain: Secondary | ICD-10-CM | POA: Diagnosis not present

## 2016-07-07 MED ORDER — BUPIVACAINE HCL 0.5 % IJ SOLN
3.0000 mL | INTRAMUSCULAR | Status: AC | PRN
  Administered 2016-07-07: 3 mL via INTRA_ARTICULAR

## 2016-07-07 MED ORDER — METHYLPREDNISOLONE ACETATE 40 MG/ML IJ SUSP
80.0000 mg | INTRAMUSCULAR | Status: AC | PRN
  Administered 2016-07-07: 80 mg

## 2016-07-07 MED ORDER — LIDOCAINE HCL 1 % IJ SOLN
5.0000 mL | INTRAMUSCULAR | Status: AC | PRN
  Administered 2016-07-07: 5 mL

## 2016-07-07 NOTE — Progress Notes (Signed)
Office Visit Note   Patient: [name]           Date of Birth: [birth year]           MRN: [id number] Visit Date: 07/07/2016              Requested by: Mayra Neer, MD [address] [place], [place] [address] PCP: Mayra Neer, MD   Assessment & Plan: Visit Diagnoses: Chronic osteoarthritis left knee-tricompartmental  Plan cortisone injection, discussed Visco supplementation and total knee replacement in some detail. Mr. Shurn will monitor his response to cortisone and consider any the above over time.   Follow-Up Instructions: No Follow-up on file.   Orders:  No orders of the defined types were placed in this encounter.  No orders of the defined types were placed in this encounter.     Procedures: Large Joint Inj Date/Time: 07/07/2016 8:55 AM Performed by: Garald Balding Authorized by: Garald Balding   Consent Given by:  Patient Timeout: prior to procedure the correct patient, procedure, and site was verified   Indications:  Pain and joint swelling Location:  Knee Site:  L knee Prep: patient was prepped and draped in usual sterile fashion   Needle Size:  25 G Needle Length:  1.5 inches Approach:  Anteromedial Ultrasound Guidance: No   Fluoroscopic Guidance: No   Arthrogram: No   Medications:  5 mL lidocaine 1 %; 80 mg methylPREDNISolone acetate 40 MG/ML; 3 mL bupivacaine 0.5 % Aspiration Attempted: No   Patient tolerance:  Patient tolerated the procedure well with no immediate complications     Clinical Data: No additional findings.   Subjective: Chief Complaint  Patient presents with  . Left Knee - Pain    Left knee pain x 6 weeks, popping, difficulty walking, no injury, swelling, some difficulty with steps, history of arthroscopic surgery, diabetic, Tylenol helps, brace  Last seen in November 2017 with x-rays demonstrating tricompartmental osteoarthritis the x-ray changes are fairly advanced with peripheral osteophytes,  subchondral sclerosis and joint space narrowing. Does wear a spider brace and uses over-the-counter medicines which provides some relief. He's not interested in a knee replacement at this point.  Review of Systems   Objective: Vital Signs: BP 136/73 (BP Location: Left Arm, Patient Position: Sitting, Cuff Size: Normal)   Pulse 76   Resp 14   Ht 5\' 7"  (1.702 m)   Wt 238 lb (108 kg)   BMI 37.28 kg/m   Physical Exam  Ortho Exam knee with positive patellar crepitation. Full extension and about 111 of flexion using the goniometer. No distal edema. Neurovascular exam intact. Does walk with a mild limp the left lower extremity. No groin pain. Straight leg raise negative. No effusion. Predominantly medial joint pain  Specialty Comments:  No specialty comments available.  Imaging: No results found.   PMFS History: There are no active problems to display for this patient.  Past Medical History:  Diagnosis Date  . Anxiety   . Hyperlipidemia   . Hypertension     History reviewed. No pertinent family history.  Past Surgical History:  Procedure Laterality Date  . bicep surgery Bilateral   . CHOLECYSTECTOMY    . COLON SURGERY    . EYE SURGERY    . KNEE SURGERY Left   . SHOULDER SURGERY Left    Social History   Occupational History  . Not on file.   Social History Main Topics  . Smoking status: Never Smoker  . Smokeless  tobacco: Never Used  . Alcohol use 5.4 oz/week    3 Shots of liquor, 6 Cans of beer per week  . Drug use: No  . Sexual activity: Not on file

## 2016-07-10 ENCOUNTER — Ambulatory Visit (INDEPENDENT_AMBULATORY_CARE_PROVIDER_SITE_OTHER): Payer: Medicare Other | Admitting: Orthopaedic Surgery

## 2017-04-03 ENCOUNTER — Other Ambulatory Visit: Payer: Self-pay | Admitting: General Surgery

## 2017-04-03 DIAGNOSIS — K439 Ventral hernia without obstruction or gangrene: Secondary | ICD-10-CM

## 2017-04-11 ENCOUNTER — Ambulatory Visit
Admission: RE | Admit: 2017-04-11 | Discharge: 2017-04-11 | Disposition: A | Discharge status: Home / Self Care | Payer: Medicare Other | Source: Ambulatory Visit | Attending: General Surgery | Admitting: General Surgery

## 2017-04-11 DIAGNOSIS — K439 Ventral hernia without obstruction or gangrene: Secondary | ICD-10-CM

## 2017-04-11 MED ORDER — IOPAMIDOL (ISOVUE-300) INJECTION 61%
125.0000 mL | Freq: Once | INTRAVENOUS | Status: AC | PRN
  Administered 2017-04-11: 125 mL via INTRAVENOUS

## 2017-11-23 ENCOUNTER — Ambulatory Visit (INDEPENDENT_AMBULATORY_CARE_PROVIDER_SITE_OTHER): Payer: Self-pay

## 2017-11-23 ENCOUNTER — Encounter (INDEPENDENT_AMBULATORY_CARE_PROVIDER_SITE_OTHER): Payer: Self-pay | Admitting: Orthopaedic Surgery

## 2017-11-23 ENCOUNTER — Ambulatory Visit (INDEPENDENT_AMBULATORY_CARE_PROVIDER_SITE_OTHER): Payer: Medicare Other | Admitting: Orthopaedic Surgery

## 2017-11-23 ENCOUNTER — Encounter (INDEPENDENT_AMBULATORY_CARE_PROVIDER_SITE_OTHER): Payer: Self-pay

## 2017-11-23 VITALS — BP 116/83 | HR 50 | Ht 66.0 in | Wt 232.0 lb

## 2017-11-23 DIAGNOSIS — M25561 Pain in right knee: Secondary | ICD-10-CM

## 2017-11-23 NOTE — Progress Notes (Signed)
Office Visit Note   Patient: [name]           Date of Birth: [birth year]           MRN: [id number] Visit Date: 11/23/2017              Requested by: Louis Neer, MD [address] [place], [place] [address] PCP: Louis Neer, MD   Assessment & Plan: Visit Diagnoses:  1. Acute pain of right knee     Plan: Traumatic hematoma medial proximal right tibia.  This should resolve on its own.  Would suggest heat.  Office 3 to 4 weeks if no improvement.  No evidence of infection.  No neurologic deficit Follow-Up Instructions: Return if symptoms worsen or fail to improve.   Orders:  Orders Placed This Encounter  Procedures  . XR KNEE 3 VIEW RIGHT   No orders of the defined types were placed in this encounter.     Procedures: No procedures performed   Clinical Data: No additional findings.   Subjective: Chief Complaint  Patient presents with  . New Patient (Initial Visit)    R KNEE PAIN GOAT RAMMED PT IN KNEE 1 WK  Mr. [name] relates that he was struck along the medial aspect of his right knee by his neighbors goat last week.  He is developed hematoma on the medial aspect of his knee.  He has not had any fever or chills.  He is a diabetic but has no signs of infection.  No neurologic deficit.  HPI  Review of Systems  Constitutional: Negative for fatigue and fever.  HENT: Negative for ear pain.   Eyes: Positive for pain.  Respiratory: Negative for cough and shortness of breath.   Cardiovascular: Positive for leg swelling.  Gastrointestinal: Negative for constipation and diarrhea.  Genitourinary: Negative for difficulty urinating.  Musculoskeletal: Negative for back pain and neck pain.  Skin: Negative for rash.  Allergic/Immunologic: Negative for food allergies.  Neurological: Positive for weakness. Negative for numbness.  Hematological: Bruises/bleeds easily.  Psychiatric/Behavioral: Positive for sleep disturbance.     Objective: Vital  Signs: BP 116/83 (BP Location: Left Arm, Patient Position: Sitting, Cuff Size: Normal)   Pulse (!) 50   Ht 5\' 6"  (1.676 m)   Wt 232 lb (105.2 kg)   BMI 37.45 kg/m   Physical Exam  Constitutional: He is oriented to person, place, and time. He appears well-developed and well-nourished.  HENT:  Mouth/Throat: Oropharynx is clear and moist.  Eyes: Pupils are equal, round, and reactive to light. EOM are normal.  Pulmonary/Chest: Effort normal.  Neurological: He is alert and oriented to person, place, and time.  Skin: Skin is warm and dry.  Psychiatric: He has a normal mood and affect. His behavior is normal.    Ortho Exam awake alert and oriented x3.  Comfortable sitting.  Hematoma about 5 to 6 cm in diameter along the medial proximal tibial metaphysis.  Able to walk without ambulatory aid.  No knee effusion or localized knee pain.  Some resolving ecchymosis distally in his leg.  Some mild venous stasis changes.  No ankle swelling.  No evidence of infection i.e. erythema or increased heat about the hematoma. x-rays negative for any acute changes  Specialty Comments:  No specialty comments available.  Imaging: Xr Knee 3 View Right  Result Date: 11/23/2017 Terms of the right knee were obtained in several projections standing no evidence of any acute changes.  Patient has a hematoma along  the medial proximal tibia.  No evidence of a fracture.  There are degenerative changes in all 3 compartments but more in the lateral compartment where there is some chondral sclerosis and small peripheral osteophytes.  Considerable degenerative change about the patellofemoral joint    PMFS History: There are no active problems to display for this patient.  Past Medical History:  Diagnosis Date  . Anxiety   . Hyperlipidemia   . Hypertension     History reviewed. No pertinent family history.  Past Surgical History:  Procedure Laterality Date  . bicep surgery Bilateral   . CHOLECYSTECTOMY    . COLON  SURGERY    . EYE SURGERY    . KNEE SURGERY Left   . SHOULDER SURGERY Left    Social History   Occupational History  . Not on file  Tobacco Use  . Smoking status: Never Smoker  . Smokeless tobacco: Never Used  Substance and Sexual Activity  . Alcohol use: Yes    Alcohol/week: 5.4 oz    Types: 3 Shots of liquor, 6 Cans of beer per week  . Drug use: No  . Sexual activity: Not on file

## 2018-05-10 DIAGNOSIS — H40021 Open angle with borderline findings, high risk, right eye: Secondary | ICD-10-CM | POA: Insufficient documentation

## 2018-05-10 DIAGNOSIS — H40042 Steroid responder, left eye: Secondary | ICD-10-CM | POA: Insufficient documentation

## 2018-05-10 DIAGNOSIS — H401122 Primary open-angle glaucoma, left eye, moderate stage: Secondary | ICD-10-CM | POA: Insufficient documentation

## 2018-10-16 ENCOUNTER — Other Ambulatory Visit: Payer: Self-pay | Admitting: Family Medicine

## 2018-10-16 DIAGNOSIS — N5089 Other specified disorders of the male genital organs: Secondary | ICD-10-CM

## 2018-10-28 ENCOUNTER — Other Ambulatory Visit: Payer: Medicare Other

## 2018-11-04 ENCOUNTER — Ambulatory Visit
Admission: RE | Admit: 2018-11-04 | Discharge: 2018-11-04 | Disposition: A | Discharge status: Home / Self Care | Payer: Medicare Other | Source: Ambulatory Visit | Attending: Family Medicine | Admitting: Family Medicine

## 2018-11-04 DIAGNOSIS — N5089 Other specified disorders of the male genital organs: Secondary | ICD-10-CM

## 2019-01-03 ENCOUNTER — Ambulatory Visit (INDEPENDENT_AMBULATORY_CARE_PROVIDER_SITE_OTHER): Payer: Medicare Other | Admitting: Podiatry

## 2019-01-03 ENCOUNTER — Other Ambulatory Visit: Payer: Self-pay

## 2019-01-03 ENCOUNTER — Encounter: Payer: Self-pay | Admitting: Podiatry

## 2019-01-03 VITALS — BP 127/90

## 2019-01-03 DIAGNOSIS — M79675 Pain in left toe(s): Secondary | ICD-10-CM | POA: Diagnosis not present

## 2019-01-03 DIAGNOSIS — M79674 Pain in right toe(s): Secondary | ICD-10-CM

## 2019-01-03 DIAGNOSIS — L600 Ingrowing nail: Secondary | ICD-10-CM

## 2019-01-03 DIAGNOSIS — B351 Tinea unguium: Secondary | ICD-10-CM | POA: Diagnosis not present

## 2019-01-03 MED ORDER — NEOMYCIN-POLYMYXIN-HC 3.5-10000-1 OT SOLN: OTIC | 1 refills | Status: DC

## 2019-01-03 NOTE — Patient Instructions (Signed)

## 2019-01-06 NOTE — Progress Notes (Signed)
Subjective:   Patient ID: [name], male   DOB: 73 y.o.   MRN: [id number]   HPI Patient presents with chronic nail disease 1-5 both feet that are thick and the big toenail left is very thickened dystrophic and it is moderately painful when pressed.  Patient states he wants it removed permanently and he wants the other nails trimmed and patient does not smoke likes to be active   Review of Systems  All other systems reviewed and are negative.       Objective:  Physical Exam Vitals signs and nursing note reviewed.  Constitutional:      Appearance: He is well-developed.  Pulmonary:     Effort: Pulmonary effort is normal.  Musculoskeletal: Normal range of motion.  Skin:    General: Skin is warm.  Neurological:     Mental Status: He is alert.     Neurovascular status intact muscle strength found to be adequate range of motion within normal limits with patient noted to have a dystrophic thickened big toenail left that is painful when palpated and has thickened nailbeds 1-5 both feet overall     Assessment:  Damaged left hallux nailbed with pain along with thick yellow brittle nailbeds 1-5 both feet     Plan:  H&P conditions reviewed and recommended correction left allowing patient to go over consent form discussing procedure and risk.  Signed consent form today I infiltrated the left hallux 60 mg like Marcaine mixture sterile prep applied using sterile instrumentation remove the hallux nail exposed matrix and applied phenol 3 applications 36 followed by alcohol lavage sterile dressing gave instructions on soaks.  Debrided all remaining nails prescribed drops and encouraged to call with questions concerns

## 2019-03-18 ENCOUNTER — Encounter: Payer: Self-pay | Admitting: Podiatry

## 2019-03-18 ENCOUNTER — Ambulatory Visit: Payer: Medicare Other | Admitting: Podiatry

## 2019-03-18 ENCOUNTER — Other Ambulatory Visit: Payer: Self-pay

## 2019-03-18 DIAGNOSIS — M79674 Pain in right toe(s): Secondary | ICD-10-CM

## 2019-03-18 DIAGNOSIS — B351 Tinea unguium: Secondary | ICD-10-CM | POA: Diagnosis not present

## 2019-03-18 DIAGNOSIS — M79675 Pain in left toe(s): Secondary | ICD-10-CM | POA: Diagnosis not present

## 2019-03-18 NOTE — Progress Notes (Signed)
Complaint:  Visit Type: Patient returns to my office for continued preventative foot care services. Complaint: Patient states" my nails have grown long and thick and become painful to walk and wear shoes" Patient had nail surgery for removal nail plate second toe left foot. The patient presents for preventative foot care services.  Podiatric Exam: Vascular: dorsalis pedis and posterior tibial pulses are palpable bilateral. Capillary return is immediate. Temperature gradient is WNL. Skin turgor WNL  Sensorium: Normal Semmes Weinstein monofilament test. Normal tactile sensation bilaterally. Nail Exam: Pt has thick disfigured discolored nails with subungual debris noted bilateral entire nail hallux through fifth toenails except second toe left foot. Ulcer Exam: There is no evidence of ulcer or pre-ulcerative changes or infection. Orthopedic Exam: Muscle tone and strength are WNL. No limitations in general ROM. No crepitus or effusions noted. Foot type and digits show no abnormalities. Bony prominences are unremarkable. Skin: No Porokeratosis. No infection or ulcers  Diagnosis:  Onychomycosis, , Pain in right toe, pain in left toes  Treatment & Plan Procedures and Treatment: Consent by patient was obtained for treatment procedures.   Debridement of mycotic and hypertrophic toenails, 1 through 5 bilateral and clearing of subungual debris. No ulceration, no infection noted.  Return Visit-Office Procedure: Patient instructed to return to the office for a follow up visit 3 months for continued evaluation and treatment.    Gardiner Barefoot DPM

## 2019-06-18 ENCOUNTER — Other Ambulatory Visit: Payer: Self-pay

## 2019-06-18 ENCOUNTER — Ambulatory Visit: Payer: Medicare Other | Admitting: Podiatry

## 2019-06-18 ENCOUNTER — Encounter: Payer: Self-pay | Admitting: Podiatry

## 2019-06-18 DIAGNOSIS — B351 Tinea unguium: Secondary | ICD-10-CM

## 2019-06-18 DIAGNOSIS — M79675 Pain in left toe(s): Secondary | ICD-10-CM

## 2019-06-18 DIAGNOSIS — M79674 Pain in right toe(s): Secondary | ICD-10-CM | POA: Diagnosis not present

## 2019-06-18 NOTE — Progress Notes (Signed)
Complaint:  Visit Type: Patient returns to my office for continued preventative foot care services. Complaint: Patient states" my nails have grown long and thick and become painful to walk and wear shoes"  The patient presents for preventative foot care services.  Podiatric Exam: Vascular: dorsalis pedis and posterior tibial pulses are palpable bilateral. Capillary return is immediate. Temperature gradient is WNL. Skin turgor WNL  Sensorium: Normal Semmes Weinstein monofilament test. Normal tactile sensation bilaterally. Nail Exam: Pt has thick disfigured discolored nails with subungual debris noted bilateral entire nail hallux through fifth toenails except second toe left foot. Ulcer Exam: There is no evidence of ulcer or pre-ulcerative changes or infection. Orthopedic Exam: Muscle tone and strength are WNL. No limitations in general ROM. No crepitus or effusions noted. Foot type and digits show no abnormalities. Bony prominences are unremarkable. Skin: No Porokeratosis. No infection or ulcers  Diagnosis:  Onychomycosis, , Pain in right toe, pain in left toes  Treatment & Plan Procedures and Treatment: Consent by patient was obtained for treatment procedures.   Debridement of mycotic and hypertrophic toenails, 1 through 5 bilateral and clearing of subungual debris. No ulceration, no infection noted.  Return Visit-Office Procedure: Patient instructed to return to the office for a follow up visit 3 months for continued evaluation and treatment.    Gardiner Barefoot DPM

## 2020-02-04 DIAGNOSIS — E119 Type 2 diabetes mellitus without complications: Secondary | ICD-10-CM | POA: Insufficient documentation

## 2020-06-11 ENCOUNTER — Other Ambulatory Visit: Payer: Self-pay | Admitting: Family Medicine

## 2020-06-11 ENCOUNTER — Ambulatory Visit
Admission: RE | Admit: 2020-06-11 | Discharge: 2020-06-11 | Disposition: A | Discharge status: Home / Self Care | Payer: Medicare Other | Source: Ambulatory Visit | Attending: Family Medicine | Admitting: Family Medicine

## 2020-06-11 DIAGNOSIS — M549 Dorsalgia, unspecified: Secondary | ICD-10-CM

## 2020-12-29 DIAGNOSIS — H401122 Primary open-angle glaucoma, left eye, moderate stage: Secondary | ICD-10-CM | POA: Diagnosis not present

## 2020-12-29 DIAGNOSIS — E119 Type 2 diabetes mellitus without complications: Secondary | ICD-10-CM | POA: Diagnosis not present

## 2020-12-29 DIAGNOSIS — H40021 Open angle with borderline findings, high risk, right eye: Secondary | ICD-10-CM | POA: Diagnosis not present

## 2021-01-26 DIAGNOSIS — K439 Ventral hernia without obstruction or gangrene: Secondary | ICD-10-CM | POA: Diagnosis not present

## 2021-01-26 DIAGNOSIS — E1169 Type 2 diabetes mellitus with other specified complication: Secondary | ICD-10-CM | POA: Diagnosis not present

## 2021-01-26 DIAGNOSIS — E782 Mixed hyperlipidemia: Secondary | ICD-10-CM | POA: Diagnosis not present

## 2021-01-26 DIAGNOSIS — H409 Unspecified glaucoma: Secondary | ICD-10-CM | POA: Diagnosis not present

## 2021-01-26 DIAGNOSIS — I10 Essential (primary) hypertension: Secondary | ICD-10-CM | POA: Diagnosis not present

## 2021-01-27 DIAGNOSIS — G4733 Obstructive sleep apnea (adult) (pediatric): Secondary | ICD-10-CM | POA: Diagnosis not present

## 2021-02-07 DIAGNOSIS — Z961 Presence of intraocular lens: Secondary | ICD-10-CM | POA: Diagnosis not present

## 2021-02-07 DIAGNOSIS — Z9841 Cataract extraction status, right eye: Secondary | ICD-10-CM | POA: Diagnosis not present

## 2021-02-07 DIAGNOSIS — T8522XD Displacement of intraocular lens, subsequent encounter: Secondary | ICD-10-CM | POA: Diagnosis not present

## 2021-02-07 DIAGNOSIS — H401130 Primary open-angle glaucoma, bilateral, stage unspecified: Secondary | ICD-10-CM | POA: Diagnosis not present

## 2021-03-24 DIAGNOSIS — H40021 Open angle with borderline findings, high risk, right eye: Secondary | ICD-10-CM | POA: Diagnosis not present

## 2021-03-24 DIAGNOSIS — T8522XS Displacement of intraocular lens, sequela: Secondary | ICD-10-CM | POA: Diagnosis not present

## 2021-03-24 DIAGNOSIS — H04123 Dry eye syndrome of bilateral lacrimal glands: Secondary | ICD-10-CM | POA: Diagnosis not present

## 2021-03-24 DIAGNOSIS — H35373 Puckering of macula, bilateral: Secondary | ICD-10-CM | POA: Diagnosis not present

## 2021-03-24 DIAGNOSIS — H59811 Chorioretinal scars after surgery for detachment, right eye: Secondary | ICD-10-CM | POA: Diagnosis not present

## 2021-03-24 DIAGNOSIS — H35352 Cystoid macular degeneration, left eye: Secondary | ICD-10-CM | POA: Diagnosis not present

## 2021-04-13 DIAGNOSIS — C44329 Squamous cell carcinoma of skin of other parts of face: Secondary | ICD-10-CM | POA: Diagnosis not present

## 2021-04-13 DIAGNOSIS — B079 Viral wart, unspecified: Secondary | ICD-10-CM | POA: Diagnosis not present

## 2021-04-13 DIAGNOSIS — D492 Neoplasm of unspecified behavior of bone, soft tissue, and skin: Secondary | ICD-10-CM | POA: Diagnosis not present

## 2021-05-24 DIAGNOSIS — Z481 Encounter for planned postprocedural wound closure: Secondary | ICD-10-CM | POA: Diagnosis not present

## 2021-05-24 DIAGNOSIS — C44329 Squamous cell carcinoma of skin of other parts of face: Secondary | ICD-10-CM | POA: Diagnosis not present

## 2021-05-24 DIAGNOSIS — C4492 Squamous cell carcinoma of skin, unspecified: Secondary | ICD-10-CM | POA: Diagnosis not present

## 2021-06-21 DIAGNOSIS — Z0000 Encounter for general adult medical examination without abnormal findings: Secondary | ICD-10-CM | POA: Diagnosis not present

## 2021-06-21 DIAGNOSIS — I10 Essential (primary) hypertension: Secondary | ICD-10-CM | POA: Diagnosis not present

## 2021-06-21 DIAGNOSIS — G4733 Obstructive sleep apnea (adult) (pediatric): Secondary | ICD-10-CM | POA: Diagnosis not present

## 2021-06-21 DIAGNOSIS — E1169 Type 2 diabetes mellitus with other specified complication: Secondary | ICD-10-CM | POA: Diagnosis not present

## 2021-06-21 DIAGNOSIS — E782 Mixed hyperlipidemia: Secondary | ICD-10-CM | POA: Diagnosis not present

## 2021-06-21 DIAGNOSIS — L409 Psoriasis, unspecified: Secondary | ICD-10-CM | POA: Diagnosis not present

## 2021-06-21 DIAGNOSIS — K439 Ventral hernia without obstruction or gangrene: Secondary | ICD-10-CM | POA: Diagnosis not present

## 2021-06-21 DIAGNOSIS — H409 Unspecified glaucoma: Secondary | ICD-10-CM | POA: Diagnosis not present

## 2021-06-24 DIAGNOSIS — L570 Actinic keratosis: Secondary | ICD-10-CM | POA: Diagnosis not present

## 2021-06-24 DIAGNOSIS — D485 Neoplasm of uncertain behavior of skin: Secondary | ICD-10-CM | POA: Diagnosis not present

## 2021-07-06 DIAGNOSIS — E119 Type 2 diabetes mellitus without complications: Secondary | ICD-10-CM | POA: Diagnosis not present

## 2021-07-06 DIAGNOSIS — E1169 Type 2 diabetes mellitus with other specified complication: Secondary | ICD-10-CM | POA: Diagnosis not present

## 2021-07-06 DIAGNOSIS — H40021 Open angle with borderline findings, high risk, right eye: Secondary | ICD-10-CM | POA: Diagnosis not present

## 2021-07-06 DIAGNOSIS — H04123 Dry eye syndrome of bilateral lacrimal glands: Secondary | ICD-10-CM | POA: Diagnosis not present

## 2021-07-06 DIAGNOSIS — H401122 Primary open-angle glaucoma, left eye, moderate stage: Secondary | ICD-10-CM | POA: Diagnosis not present

## 2021-07-08 DIAGNOSIS — K921 Melena: Secondary | ICD-10-CM | POA: Diagnosis not present

## 2021-07-08 DIAGNOSIS — Z85038 Personal history of other malignant neoplasm of large intestine: Secondary | ICD-10-CM | POA: Diagnosis not present

## 2021-07-08 DIAGNOSIS — K625 Hemorrhage of anus and rectum: Secondary | ICD-10-CM | POA: Diagnosis not present

## 2021-07-18 DIAGNOSIS — R945 Abnormal results of liver function studies: Secondary | ICD-10-CM | POA: Diagnosis not present

## 2021-08-03 DIAGNOSIS — K319 Disease of stomach and duodenum, unspecified: Secondary | ICD-10-CM | POA: Diagnosis not present

## 2021-08-03 DIAGNOSIS — C189 Malignant neoplasm of colon, unspecified: Secondary | ICD-10-CM | POA: Diagnosis not present

## 2021-08-03 DIAGNOSIS — C184 Malignant neoplasm of transverse colon: Secondary | ICD-10-CM | POA: Diagnosis not present

## 2021-08-03 DIAGNOSIS — D123 Benign neoplasm of transverse colon: Secondary | ICD-10-CM | POA: Diagnosis not present

## 2021-08-03 DIAGNOSIS — D120 Benign neoplasm of cecum: Secondary | ICD-10-CM | POA: Diagnosis not present

## 2021-08-03 DIAGNOSIS — K317 Polyp of stomach and duodenum: Secondary | ICD-10-CM | POA: Diagnosis not present

## 2021-08-03 DIAGNOSIS — K5730 Diverticulosis of large intestine without perforation or abscess without bleeding: Secondary | ICD-10-CM | POA: Diagnosis not present

## 2021-08-03 DIAGNOSIS — K2950 Unspecified chronic gastritis without bleeding: Secondary | ICD-10-CM | POA: Diagnosis not present

## 2021-08-03 DIAGNOSIS — D122 Benign neoplasm of ascending colon: Secondary | ICD-10-CM | POA: Diagnosis not present

## 2021-08-03 DIAGNOSIS — K6289 Other specified diseases of anus and rectum: Secondary | ICD-10-CM | POA: Diagnosis not present

## 2021-08-03 DIAGNOSIS — K921 Melena: Secondary | ICD-10-CM | POA: Diagnosis not present

## 2021-08-03 DIAGNOSIS — Z980 Intestinal bypass and anastomosis status: Secondary | ICD-10-CM | POA: Diagnosis not present

## 2021-08-03 DIAGNOSIS — D490 Neoplasm of unspecified behavior of digestive system: Secondary | ICD-10-CM | POA: Diagnosis not present

## 2021-08-03 DIAGNOSIS — K621 Rectal polyp: Secondary | ICD-10-CM | POA: Diagnosis not present

## 2021-08-03 DIAGNOSIS — K625 Hemorrhage of anus and rectum: Secondary | ICD-10-CM | POA: Diagnosis not present

## 2021-08-05 ENCOUNTER — Other Ambulatory Visit: Payer: Self-pay | Admitting: Gastroenterology

## 2021-08-05 DIAGNOSIS — K6389 Other specified diseases of intestine: Secondary | ICD-10-CM

## 2021-08-08 ENCOUNTER — Ambulatory Visit
Admission: RE | Admit: 2021-08-08 | Discharge: 2021-08-08 | Disposition: A | Discharge status: Home / Self Care | Payer: Medicare Other | Source: Ambulatory Visit | Attending: Gastroenterology | Admitting: Gastroenterology

## 2021-08-08 ENCOUNTER — Other Ambulatory Visit: Payer: Medicare Other

## 2021-08-08 DIAGNOSIS — K6389 Other specified diseases of intestine: Secondary | ICD-10-CM

## 2021-08-08 DIAGNOSIS — D7389 Other diseases of spleen: Secondary | ICD-10-CM | POA: Diagnosis not present

## 2021-08-08 DIAGNOSIS — J984 Other disorders of lung: Secondary | ICD-10-CM | POA: Diagnosis not present

## 2021-08-08 DIAGNOSIS — K439 Ventral hernia without obstruction or gangrene: Secondary | ICD-10-CM | POA: Diagnosis not present

## 2021-08-08 DIAGNOSIS — M19012 Primary osteoarthritis, left shoulder: Secondary | ICD-10-CM | POA: Diagnosis not present

## 2021-08-08 DIAGNOSIS — C189 Malignant neoplasm of colon, unspecified: Secondary | ICD-10-CM | POA: Diagnosis not present

## 2021-08-08 DIAGNOSIS — I2510 Atherosclerotic heart disease of native coronary artery without angina pectoris: Secondary | ICD-10-CM | POA: Diagnosis not present

## 2021-08-08 DIAGNOSIS — M19011 Primary osteoarthritis, right shoulder: Secondary | ICD-10-CM | POA: Diagnosis not present

## 2021-08-08 DIAGNOSIS — K5730 Diverticulosis of large intestine without perforation or abscess without bleeding: Secondary | ICD-10-CM | POA: Diagnosis not present

## 2021-08-08 MED ORDER — IOPAMIDOL (ISOVUE-300) INJECTION 61%
100.0000 mL | Freq: Once | INTRAVENOUS | Status: AC | PRN
  Administered 2021-08-08: 100 mL via INTRAVENOUS

## 2021-08-09 ENCOUNTER — Encounter: Payer: Self-pay | Admitting: *Deleted

## 2021-08-09 DIAGNOSIS — C184 Malignant neoplasm of transverse colon: Secondary | ICD-10-CM | POA: Diagnosis not present

## 2021-08-09 NOTE — Progress Notes (Signed)
PATIENT NAVIGATOR PROGRESS NOTE ? ?Name: [name] ?Date: 08/09/2021 ?MRN: [id number]  ?DOB: 09/07/1945 ? ? ?Reason for visit:  ?Introductory phone call ? ?Comments:  Spoke with patient and discussed New patient appt for 08/15/21 with Dr Benay Spice at 1:40.  He met with Dr Dema Severin this am and is expecting surgery to be scheduled sometime later this month. ? ?Discussed what to expect with visit, reviewed directions, parking, mask policy and one support person allowed during visit. Verbalized understanding.  ?Contact information given and encouraged to call with any issues or questions ? ? ? ?Time spent counseling/coordinating care: 15-30 minutes ? ?

## 2021-08-11 DIAGNOSIS — K319 Disease of stomach and duodenum, unspecified: Secondary | ICD-10-CM | POA: Diagnosis not present

## 2021-08-11 DIAGNOSIS — C189 Malignant neoplasm of colon, unspecified: Secondary | ICD-10-CM | POA: Diagnosis not present

## 2021-08-11 DIAGNOSIS — K317 Polyp of stomach and duodenum: Secondary | ICD-10-CM | POA: Diagnosis not present

## 2021-08-11 DIAGNOSIS — D122 Benign neoplasm of ascending colon: Secondary | ICD-10-CM | POA: Diagnosis not present

## 2021-08-11 DIAGNOSIS — D123 Benign neoplasm of transverse colon: Secondary | ICD-10-CM | POA: Diagnosis not present

## 2021-08-11 DIAGNOSIS — K621 Rectal polyp: Secondary | ICD-10-CM | POA: Diagnosis not present

## 2021-08-15 ENCOUNTER — Encounter: Payer: Self-pay | Admitting: *Deleted

## 2021-08-15 ENCOUNTER — Inpatient Hospital Stay: Payer: Medicare Other | Attending: Oncology | Admitting: Oncology

## 2021-08-15 VITALS — BP 153/76 | HR 77 | Temp 97.8°F | Resp 18 | Ht 66.0 in | Wt 230.0 lb

## 2021-08-15 DIAGNOSIS — I10 Essential (primary) hypertension: Secondary | ICD-10-CM

## 2021-08-15 DIAGNOSIS — E785 Hyperlipidemia, unspecified: Secondary | ICD-10-CM | POA: Diagnosis not present

## 2021-08-15 DIAGNOSIS — G4733 Obstructive sleep apnea (adult) (pediatric): Secondary | ICD-10-CM | POA: Diagnosis not present

## 2021-08-15 DIAGNOSIS — C184 Malignant neoplasm of transverse colon: Secondary | ICD-10-CM | POA: Diagnosis not present

## 2021-08-15 DIAGNOSIS — Z800 Family history of malignant neoplasm of digestive organs: Secondary | ICD-10-CM

## 2021-08-15 DIAGNOSIS — C189 Malignant neoplasm of colon, unspecified: Secondary | ICD-10-CM | POA: Insufficient documentation

## 2021-08-15 NOTE — Progress Notes (Signed)
?[facility] ?New Patient Consult ? ? ?Requesting MD: ?Otis Brace, Md ?[address] ?[place],  [place] [id number] ? ? ?[name] ?76 y.o.  ?[birth year] ? ?  ?Reason for Consult: Colon cancer ? ? ?HPI: [name] has a history of colon cancer diagnosed in 2006.  He completed a sigmoid colectomy and did not receive adjuvant therapy. ?He reports 3 days of bleeding with bowel movements in early March.  He was referred to Dr. Alessandra Bevels and underwent a colonoscopy 08/03/2021.  Polyps were removed from the cecum, ascending colon, and transverse colon a mass was found in the transverse colon.  The mass was biopsied and the area was tattooed.  There was a prior end-to-side anastomosis in the descending colon.  A polyp was removed from the rectum. ?Upper endoscopy the same day revealed erythema in the stomach and sessile diminutive polyps in the gastric body. ? ?The pathology revealed reactive gastropathy and a fundic gland polyp.  Polyps from the ascending colon and cecum returned as a tubular adenoma and sessile serrated adenoma.  The transverse colon polyp returned as a tubular adenoma with focal high-grade dysplasia.  The transverse colon mass returned as invasive adenocarcinoma.  Mismatch repair protein expression returned normal.  The rectal polyp was a hyperplastic polyp. ? ?CTs of the chest, abdomen, and pelvis on 08/08/2021 showed a 1.1 cm cutaneous soft tissue nodule in the low left axilla.  Unchanged lung nodules from 2011.  Complex abdominal wall hernia. ? ?He was referred to Dr. Dema Severin and is being scheduled for colon surgery. ? ?No complaint other than rectal bleeding. ? ?Past Medical History:  ?Diagnosis Date  ? Anxiety   ? Hyperlipidemia   ? Hypertension   ?  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  Marland Kitchen  Sleep apnea ?   Marland Kitchen  Squamous cell carcinoma left forehead 2023 ?  Marland Kitchen  History of bilateral retinal detachment ? ?Past Surgical History:  ?Procedure Laterality Date  ? bicep surgery Bilateral   ? CHOLECYSTECTOMY    ? COLON SURGERY Splenic flexure resection 2006  ? EYE SURGERY    ? KNEE SURGERY Left   ? SHOULDER SURGERY Left   ?  Marland Kitchen  Lateral cataract surgery ? ?Medications: Reviewed ? ?Allergies:  ?Allergies  ?Allergen Reactions  ? Ibuprofen Other (See Comments)  ?  Other Reaction: blurred vision  ? ? ?Family history: No family history of colon or rectal cancer.  A grandmother had "stomach "cancer. ? ?Social History:  ? ?He lives alone in [place].  He is retired from a [employer].  He does not use cigarettes.  Social alcohol use.  No transfusion history.  He has been a blood donor.  No risk factor for HIV or hepatitis.  He has received COVID-19 and influenza vaccines ? ?ROS:  ? ?Positives include: Intermittent "indigestion "and low abdominal discomfort, 14 pound weight loss since starting semaglutide, decreased urine volume, 3 days of rectal bleeding with bowel movements ? ?A complete ROS was otherwise negative. ? ?Physical Exam: ? ?Blood pressure (!) 153/76, pulse 77, temperature 97.8 ?F (36.6 ?C), temperature source Oral, resp. rate 18, height _0  (1.676 m), weight 230 lb (104.3 kg), SpO2 100 %. ? ?HEENT: Oropharynx without visible mass, neck without mass ?Lungs: Clear bilaterally ?Cardiac: Regular rate and rhythm ?Abdomen: Large ventral hernia, no hepatosplenomegaly, nontender ?GU: Testes  without mass ?Vascular: No leg edema ?Lymph nodes: No cervical, supraclavicular, axillary, or inguinal nodes ?Neurologic: Alert and oriented, the motor exam appears intact in the upper and lower extremities bilaterally ?Skin: No rash, 1.5 cm round mobile cutaneous lesion at the low axillary line-lateral to the nipple ?Musculoskeletal: No spine tenderness ?Breast: Left breast without mass ? ? ?LAB: ? ?CBC ? ?Lab Results  ?Component Value  Date  ? WBC 7.5 05/21/2009  ? HGB 14.7 05/21/2009  ? HCT 42.1 05/21/2009  ? MCV 87.2 05/21/2009  ? PLT 247 05/21/2009  ? NEUTROABS 4.5 05/21/2009  ?  ? ?  ? ?CMP  ?Lab Results  ?Component Value Date  ? NA 139 05/21/2009  ? K 4.0 05/21/2009  ? CL 104 05/21/2009  ? CO2 22 05/21/2009  ? GLUCOSE 119 (H) 05/21/2009  ? BUN 21 05/21/2009  ? CREATININE 0.95 05/21/2009  ? CALCIUM 9.2 05/21/2009  ? PROT 6.8 05/21/2009  ? ALBUMIN 4.5 05/21/2009  ? AST 22 05/21/2009  ? ALT 34 05/21/2009  ? ALKPHOS 53 05/21/2009  ? BILITOT 0.4 05/21/2009  ? GFRNONAA >60 12/11/2007  ? GFRAA  12/11/2007  ?  >60        ?The eGFR has been calculated ?using the MDRD equation. ?This calculation has not been ?validated in all clinical  ? ? ? ?No results found for: CEA1, [id number], CA125 ? ?Imaging: ?As per HPI-CT images from 08/08/2021 reviewed with [name] ? ? ?Assessment/Plan:  ? 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 ? ?Disposition:  ? ?[name] has been diagnosed with colon cancer.  There is no clinical or radiologic evidence of metastatic disease.  He has been scheduled for colon surgery with Dr. Dema Severin.  I suspect the left chest wall nodule is a benign finding.  He reports that Dr. Dema Severin plans to remove this at the time of surgery. ? ?I will see him following surgery to review the pathology report and make recommendations regarding adjuvant systemic therapy. ? ?He does not appear to have hereditary  nonpolyposis colon cancer syndrome, but his family members are at increased risk of developing colon cancer and should receive appropriate screening. ? ? ? ?Betsy Coder, MD  ?08/15/2021, 4:28 PM ? ? ?

## 2021-08-15 NOTE — Progress Notes (Signed)
PATIENT NAVIGATOR PROGRESS NOTE ? ?Name: BURTIS IMHOFF ?Date: 08/15/2021 ?MRN: [id number]  ?DOB: 1945-07-08 ? ? ?Reason for visit:  ?Initial visit with Dr Benay Spice ? ?Comments:  Met with Mr Mally and his daughter during visit with Dr Benay Spice.  He is awaiting surgery date from Dr Orest Dikes office.   ? ?Introduction and contact information given.  ?Barriers to care include anxiety, coordination of care, coping with diagnosis and treatment.   ? ?Dr Benay Spice will see Mr Vanbenschoten 2 weeks after surgery.  ? ? ? ?Time spent counseling/coordinating care: > 60 minutes ? ?

## 2021-08-31 NOTE — Progress Notes (Signed)
Sent message, via epic in basket, requesting orders in epic from surgeon.  

## 2021-09-06 ENCOUNTER — Ambulatory Visit: Payer: Self-pay | Admitting: Surgery

## 2021-09-06 DIAGNOSIS — Z01818 Encounter for other preprocedural examination: Secondary | ICD-10-CM

## 2021-09-06 NOTE — Progress Notes (Addendum)
Anesthesia Review: ? ?PCP:  DR Serita Grammes  ?Cardiologist : none  ?Chest x-ray : ?CT chest- 08/08/21  ?EKG : 09/07/21  ?Echo : ?Stress test: ?Cardiac Cath :  ?Activity level: can do a flight of stairs without difficulty  ?Sleep Study/ CPAP : has cpap  ?Fasting Blood Sugar :      / Checks Blood Sugar -- times a day:   ?Blood Thinner/ Instructions /Last Dose: ?ASA / Instructions/ Last Dose :   ?81 mg aspirin  ?DM- type 2 ?Does not check glucose at home  ?Hgba1c- 09/07/21 - 5.8  ?At preop appt pt states he does not have bowel prep instructions called [facility] and spoke with Elmyra Ricks in triage.  They will have him a copy of bowel prep instructions at front desk of [facility] for him to pick up.  PT made aware.  And heard conversation.  Also placed on preop instructions for him to go by [facility] and pick up bowel prep instructions   PT voiced understanding   ?Glucose 102 at preop.  PT voices no complaints.  PT given Diet coke and pack of peanut butter crackers since he has an appt to follow this appt.   ?

## 2021-09-06 NOTE — Progress Notes (Addendum)
DUE TO COVID-19 ONLY  2  VISITOR IS ALLOWED TO COME WITH YOU AND STAY IN THE WAITING ROOM ONLY DURING PRE OP AND PROCEDURE DAY OF SURGERY.   4  VISITOR  MAY VISIT WITH YOU AFTER SURGERY IN YOUR PRIVATE ROOM DURING VISITING HOURS ONLY! ?YOU MAY HAVE ONE PERSON SPEND THE NITE WITH YOU IN YOUR ROOM AFTER SURGERY.   ? ? Your procedure is scheduled on:  ?       5/12 /2023  ? Report to [facility] Main  Entrance ? ? Report to admitting at    0515            AM ?DO NOT [place], PICTURE ID OR WALLET DAY OF SURGERY.  ?  ? ? Call this number if you have problems the morning of surgery [phone]  ? ?fOLLOW bOWEL PREP PER MD  ?CLEAR LIQUID DIET ON THE DAY OF BOWEL PREP  ? ? REMEMBER: NO  SOLID FOODS , CANDY, GUM OR MINTS AFTER MIDNITE THE NITE BEFORE SURGERY .       Marland Kitchen CLEAR LIQUIDS UNTIL    0430am             DAY OF SURGERY.   .   ?cOMPLETE 2 G2 LOWER SUGAR AT 1000PM THE NITE BEFORE SURGERY.  ?cOMPLETE G2 LOWER SUGAR DRINK AT 0430 AM MORNING OF SURGERY.   ? ? ? ?CLEAR LIQUID DIET ? ? ?Foods Allowed      ?WATER ?BLACK COFFEE ( SUGAR OK, NO MILK, CREAM OR CREAMER) REGULAR AND DECAF  ?TEA ( SUGAR OK NO MILK, CREAM, OR CREAMER) REGULAR AND DECAF  ?PLAIN JELLO ( NO RED)  ?FRUIT ICES ( NO RED, NO FRUIT PULP)  ?POPSICLES ( NO RED)  ?JUICE- APPLE, WHITE GRAPE AND WHITE CRANBERRY  ?SPORT DRINK LIKE GATORADE ( NO RED)  ?CLEAR BROTH ( VEGETABLE , CHICKEN OR BEEF)                                                               ? ?    ? ?BRUSH YOUR TEETH MORNING OF SURGERY AND RINSE YOUR MOUTH OUT, NO CHEWING GUM CANDY OR MINTS. ?  ? ? Take these medicines the morning of surgery with A SIP OF WATER:  eye drops as usual , protonix  ? ? ?DO NOT TAKE ANY DIABETIC MEDICATIONS DAY OF YOUR SURGERY ?                  ?            You may not have any metal on your body including hair pins and  ?            piercings  Do not wear jewelry, make-up, lotions, powders or perfumes, deodorant ?     AM        Do not wear nail polish  on your fingernails.   ?           IF YOU ARE A MALE AND WANT TO SHAVE UNDER ARMS OR LEGS PRIOR TO SURGERY YOU MUST DO SO AT LEAST 48 HOURS PRIOR TO SURGERY.  ?            Men may shave face and neck. ? ? Do  not bring valuables to the hospital. [facility] NOT ?            RESPONSIBLE   FOR VALUABLES. ? Contacts, dentures or bridgework may not be worn into surgery. ? Leave suitcase in the car. After surgery it may be brought to your room. ? ?  ? Patients discharged the day of surgery will not be allowed to drive home. IF YOU ARE HAVING SURGERY AND GOING HOME THE SAME DAY, YOU MUST HAVE AN ADULT TO DRIVE YOU HOME AND BE WITH YOU FOR 24 HOURS. YOU MAY GO HOME BY TAXI OR UBER OR ORTHERWISE, BUT AN ADULT MUST ACCOMPANY YOU HOME AND STAY WITH YOU FOR 24 HOURS. ?  ? ?            Please read over the following fact sheets you were given: ?_____________________________________________________________________ ? ?[facility] - Preparing for Surgery ?Before surgery, you can play an important role.  Because skin is not sterile, your skin needs to be as free of germs as possible.  You can reduce the number of germs on your skin by washing with CHG (chlorahexidine gluconate) soap before surgery.  CHG is an antiseptic cleaner which kills germs and bonds with the skin to continue killing germs even after washing. ?Please DO NOT use if you have an allergy to CHG or antibacterial soaps.  If your skin becomes reddened/irritated stop using the CHG and inform your nurse when you arrive at Short Stay. ?Do not shave (including legs and underarms) for at least 48 hours prior to the first CHG shower.  You may shave your face/neck. ?Please follow these instructions carefully: ? 1.  Shower with CHG Soap the night before surgery and the  morning of Surgery. ? 2.  If you choose to wash your hair, wash your hair first as usual with your  normal  shampoo. ? 3.  After you shampoo, rinse your hair and body thoroughly to remove the  shampoo.                            4.  Use CHG as you would any other liquid soap.  You can apply chg directly  to the skin and wash  ?                     Gently with a scrungie or clean washcloth. ? 5.  Apply the CHG Soap to your body ONLY FROM THE NECK DOWN.   Do not use on face/ open      ?                     Wound or open sores. Avoid contact with eyes, ears mouth and genitals (private parts).  ?                     Production manager,  Genitals (private parts) with your normal soap. ?            6.  Wash thoroughly, paying special attention to the area where your surgery  will be performed. ? 7.  Thoroughly rinse your body with warm water from the neck down. ? 8.  DO NOT shower/wash with your normal soap after using and rinsing off  the CHG Soap. ?               9.  Pat yourself dry with a clean towel. ?  10.  Wear clean pajamas. ?           11.  Place clean sheets on your bed the night of your first shower and do not  sleep with pets. ?Day of Surgery : ?Do not apply any lotions/deodorants the morning of surgery.  Please wear clean clothes to the hospital/surgery center. ? ?FAILURE TO FOLLOW THESE INSTRUCTIONS MAY RESULT IN THE CANCELLATION OF YOUR SURGERY ?PATIENT SIGNATURE_________________________________ ? ?NURSE SIGNATURE__________________________________ ? ?________________________________________________________________________  ? ? ?           ?

## 2021-09-07 ENCOUNTER — Encounter (HOSPITAL_COMMUNITY)
Admission: RE | Admit: 2021-09-07 | Discharge: 2021-09-07 | Disposition: A | Discharge status: Home / Self Care | Payer: Medicare Other | Source: Ambulatory Visit | Attending: Surgery | Admitting: Surgery

## 2021-09-07 ENCOUNTER — Encounter (HOSPITAL_COMMUNITY): Payer: Self-pay

## 2021-09-07 ENCOUNTER — Other Ambulatory Visit: Payer: Self-pay

## 2021-09-07 VITALS — BP 143/76 | HR 61 | Temp 98.0°F | Resp 16 | Ht 66.0 in | Wt 219.0 lb

## 2021-09-07 DIAGNOSIS — Z01818 Encounter for other preprocedural examination: Secondary | ICD-10-CM

## 2021-09-07 DIAGNOSIS — E119 Type 2 diabetes mellitus without complications: Secondary | ICD-10-CM | POA: Diagnosis not present

## 2021-09-07 HISTORY — DX: Malignant (primary) neoplasm, unspecified: C80.1

## 2021-09-07 HISTORY — DX: Sleep apnea, unspecified: G47.30

## 2021-09-07 HISTORY — DX: Depression, unspecified: F32.A

## 2021-09-07 HISTORY — DX: Prediabetes: R73.03

## 2021-09-07 LAB — CBC WITH DIFFERENTIAL/PLATELET
Abs Immature Granulocytes: 0.08 10*3/uL — ABNORMAL HIGH (ref 0.00–0.07)
Basophils Absolute: 0.1 10*3/uL (ref 0.0–0.1)
Basophils Relative: 1 %
Eosinophils Absolute: 0.3 10*3/uL (ref 0.0–0.5)
Eosinophils Relative: 4 %
HCT: 42.5 % (ref 39.0–52.0)
Hemoglobin: 14.1 g/dL (ref 13.0–17.0)
Immature Granulocytes: 1 %
Lymphocytes Relative: 28 %
Lymphs Abs: 2.5 10*3/uL (ref 0.7–4.0)
MCH: 28.4 pg (ref 26.0–34.0)
MCHC: 33.2 g/dL (ref 30.0–36.0)
MCV: 85.7 fL (ref 80.0–100.0)
Monocytes Absolute: 0.7 10*3/uL (ref 0.1–1.0)
Monocytes Relative: 7 %
Neutro Abs: 5.3 10*3/uL (ref 1.7–7.7)
Neutrophils Relative %: 59 %
Platelets: 331 10*3/uL (ref 150–400)
RBC: 4.96 MIL/uL (ref 4.22–5.81)
RDW: 14 % (ref 11.5–15.5)
WBC: 8.9 10*3/uL (ref 4.0–10.5)
nRBC: 0 % (ref 0.0–0.2)

## 2021-09-07 LAB — COMPREHENSIVE METABOLIC PANEL
ALT: 22 U/L (ref 0–44)
AST: 22 U/L (ref 15–41)
Albumin: 4 g/dL (ref 3.5–5.0)
Alkaline Phosphatase: 50 U/L (ref 38–126)
Anion gap: 8 (ref 5–15)
BUN: 17 mg/dL (ref 8–23)
CO2: 28 mmol/L (ref 22–32)
Calcium: 9.8 mg/dL (ref 8.9–10.3)
Chloride: 101 mmol/L (ref 98–111)
Creatinine, Ser: 0.95 mg/dL (ref 0.61–1.24)
GFR, Estimated: >60 mL/min (ref >60)
Glucose, Bld: 100 mg/dL — ABNORMAL HIGH (ref 70–99)
Potassium: 3.5 mmol/L (ref 3.5–5.1)
Sodium: 137 mmol/L (ref 135–145)
Total Bilirubin: 0.4 mg/dL (ref 0.3–1.2)
Total Protein: 7.1 g/dL (ref 6.5–8.1)

## 2021-09-07 LAB — HEMOGLOBIN A1C
Hgb A1c MFr Bld: 5.8 % — ABNORMAL HIGH (ref 4.8–5.6)
Mean Plasma Glucose: 119.76 mg/dL

## 2021-09-07 LAB — GLUCOSE, CAPILLARY: Glucose-Capillary: 102 mg/dL — ABNORMAL HIGH (ref 70–99)

## 2021-09-14 NOTE — Progress Notes (Signed)
Abigail Butts called from [facility] and wanted preop nurse to call her regarding pt .  Spoke with Abigail Butts and Abigail Butts asked what preop instructions had been given to pt regarding ensure preop drink..  Informed Wendy preop instructions given to pt state for G2 Lower Sugar since he is diabetic.  Abigail Butts stated she was going over bowel prep instructions with pt and he stated he could not find the G2 Lower Sugar.  Preop nuse informed Abigail Butts that preop nurse had instructed pt to go to [employer]. Andto pick up clear, orange or purple colored G2 Lower Sugar.  Abigail Butts stated she would call pt back and discuss with him.   ?

## 2021-09-16 ENCOUNTER — Inpatient Hospital Stay (HOSPITAL_COMMUNITY): Payer: Medicare Other | Admitting: Physician Assistant

## 2021-09-16 ENCOUNTER — Inpatient Hospital Stay (HOSPITAL_COMMUNITY): Payer: Medicare Other | Admitting: Anesthesiology

## 2021-09-16 ENCOUNTER — Encounter (HOSPITAL_COMMUNITY): Payer: Self-pay | Admitting: Surgery

## 2021-09-16 ENCOUNTER — Encounter (HOSPITAL_COMMUNITY)
Admission: RE | Disposition: A | Discharge status: Home / Self Care | Payer: Self-pay | Source: Ambulatory Visit | Attending: Surgery

## 2021-09-16 ENCOUNTER — Inpatient Hospital Stay (HOSPITAL_COMMUNITY)
Admission: RE | Admit: 2021-09-16 | Discharge: 2021-09-19 | DRG: 330 | Disposition: A | Discharge status: Home / Self Care | Payer: Medicare Other | Source: Ambulatory Visit | Attending: Surgery | Admitting: Surgery

## 2021-09-16 ENCOUNTER — Other Ambulatory Visit: Payer: Self-pay

## 2021-09-16 DIAGNOSIS — E119 Type 2 diabetes mellitus without complications: Secondary | ICD-10-CM | POA: Diagnosis present

## 2021-09-16 DIAGNOSIS — E785 Hyperlipidemia, unspecified: Secondary | ICD-10-CM | POA: Diagnosis present

## 2021-09-16 DIAGNOSIS — D1801 Hemangioma of skin and subcutaneous tissue: Secondary | ICD-10-CM | POA: Diagnosis not present

## 2021-09-16 DIAGNOSIS — Z87891 Personal history of nicotine dependence: Secondary | ICD-10-CM

## 2021-09-16 DIAGNOSIS — R222 Localized swelling, mass and lump, trunk: Secondary | ICD-10-CM | POA: Diagnosis not present

## 2021-09-16 DIAGNOSIS — F419 Anxiety disorder, unspecified: Secondary | ICD-10-CM | POA: Diagnosis present

## 2021-09-16 DIAGNOSIS — Z7982 Long term (current) use of aspirin: Secondary | ICD-10-CM

## 2021-09-16 DIAGNOSIS — Z886 Allergy status to analgesic agent status: Secondary | ICD-10-CM | POA: Diagnosis not present

## 2021-09-16 DIAGNOSIS — C184 Malignant neoplasm of transverse colon: Secondary | ICD-10-CM | POA: Diagnosis not present

## 2021-09-16 DIAGNOSIS — C182 Malignant neoplasm of ascending colon: Secondary | ICD-10-CM | POA: Diagnosis not present

## 2021-09-16 DIAGNOSIS — G4730 Sleep apnea, unspecified: Secondary | ICD-10-CM | POA: Diagnosis present

## 2021-09-16 DIAGNOSIS — C772 Secondary and unspecified malignant neoplasm of intra-abdominal lymph nodes: Secondary | ICD-10-CM | POA: Diagnosis not present

## 2021-09-16 DIAGNOSIS — I10 Essential (primary) hypertension: Secondary | ICD-10-CM | POA: Diagnosis present

## 2021-09-16 DIAGNOSIS — Z85828 Personal history of other malignant neoplasm of skin: Secondary | ICD-10-CM

## 2021-09-16 DIAGNOSIS — K56609 Unspecified intestinal obstruction, unspecified as to partial versus complete obstruction: Secondary | ICD-10-CM | POA: Diagnosis not present

## 2021-09-16 DIAGNOSIS — K439 Ventral hernia without obstruction or gangrene: Secondary | ICD-10-CM | POA: Diagnosis present

## 2021-09-16 DIAGNOSIS — Z79899 Other long term (current) drug therapy: Secondary | ICD-10-CM | POA: Diagnosis not present

## 2021-09-16 DIAGNOSIS — D1809 Hemangioma of other sites: Secondary | ICD-10-CM | POA: Diagnosis not present

## 2021-09-16 DIAGNOSIS — Z9049 Acquired absence of other specified parts of digestive tract: Principal | ICD-10-CM

## 2021-09-16 DIAGNOSIS — D123 Benign neoplasm of transverse colon: Secondary | ICD-10-CM | POA: Diagnosis not present

## 2021-09-16 DIAGNOSIS — D494 Neoplasm of unspecified behavior of bladder: Secondary | ICD-10-CM | POA: Diagnosis not present

## 2021-09-16 HISTORY — PX: LAPAROSCOPIC RIGHT HEMI COLECTOMY: SHX5926

## 2021-09-16 HISTORY — PX: MASS EXCISION: SHX2000

## 2021-09-16 LAB — GLUCOSE, CAPILLARY
Glucose-Capillary: 122 mg/dL — ABNORMAL HIGH (ref 70–99)
Glucose-Capillary: 141 mg/dL — ABNORMAL HIGH (ref 70–99)

## 2021-09-16 LAB — TYPE AND SCREEN
ABO/RH(D): O POS
Antibody Screen: NEGATIVE

## 2021-09-16 LAB — ABO/RH: ABO/RH(D): O POS

## 2021-09-16 SURGERY — LAPAROSCOPIC RIGHT HEMI COLECTOMY
Anesthesia: General | Laterality: Right

## 2021-09-16 MED ORDER — HYDROMORPHONE HCL 1 MG/ML IJ SOLN
INTRAMUSCULAR | Status: AC
  Administered 2021-09-16: 0.5 mg via INTRAVENOUS
  Filled 2021-09-16: qty 1

## 2021-09-16 MED ORDER — FENTANYL CITRATE (PF) 250 MCG/5ML IJ SOLN
INTRAMUSCULAR | Status: AC
  Filled 2021-09-16: qty 5

## 2021-09-16 MED ORDER — BUPIVACAINE LIPOSOME 1.3 % IJ SUSP
INTRAMUSCULAR | Status: AC
  Filled 2021-09-16: qty 20

## 2021-09-16 MED ORDER — HYDROCHLOROTHIAZIDE 25 MG PO TABS
25.0000 mg | ORAL_TABLET | Freq: Every day | ORAL | Status: DC
  Administered 2021-09-17 – 2021-09-19 (×3): 25 mg via ORAL
  Filled 2021-09-16 (×3): qty 1

## 2021-09-16 MED ORDER — HYDRALAZINE HCL 20 MG/ML IJ SOLN: 10.0000 mg | INTRAMUSCULAR | Status: DC | PRN

## 2021-09-16 MED ORDER — CHLORHEXIDINE GLUCONATE CLOTH 2 % EX PADS: 6.0000 | MEDICATED_PAD | Freq: Once | CUTANEOUS | Status: DC

## 2021-09-16 MED ORDER — LACTATED RINGERS IV SOLN: INTRAVENOUS | Status: DC

## 2021-09-16 MED ORDER — NEOMYCIN SULFATE 500 MG PO TABS: 1000.0000 mg | ORAL_TABLET | ORAL | Status: DC

## 2021-09-16 MED ORDER — LIDOCAINE HCL (PF) 2 % IJ SOLN
INTRAMUSCULAR | Status: AC
  Filled 2021-09-16: qty 5

## 2021-09-16 MED ORDER — HYDROMORPHONE HCL 1 MG/ML IJ SOLN
0.5000 mg | INTRAMUSCULAR | Status: DC | PRN
  Administered 2021-09-16: 0.5 mg via INTRAVENOUS
  Filled 2021-09-16: qty 0.5

## 2021-09-16 MED ORDER — POLYETHYLENE GLYCOL 3350 17 GM/SCOOP PO POWD: 1.0000 | Freq: Once | ORAL | Status: DC

## 2021-09-16 MED ORDER — ONDANSETRON HCL 4 MG/2ML IJ SOLN: 4.0000 mg | Freq: Four times a day (QID) | INTRAMUSCULAR | Status: DC | PRN

## 2021-09-16 MED ORDER — BISACODYL 5 MG PO TBEC: 20.0000 mg | DELAYED_RELEASE_TABLET | Freq: Once | ORAL | Status: DC

## 2021-09-16 MED ORDER — SODIUM CHLORIDE 0.9 % IR SOLN
Status: DC | PRN
  Administered 2021-09-16: 1000 mL

## 2021-09-16 MED ORDER — TRAMADOL HCL 50 MG PO TABS
50.0000 mg | ORAL_TABLET | Freq: Four times a day (QID) | ORAL | Status: DC | PRN
  Administered 2021-09-16 – 2021-09-17 (×3): 50 mg via ORAL
  Filled 2021-09-16 (×3): qty 1

## 2021-09-16 MED ORDER — BUPIVACAINE LIPOSOME 1.3 % IJ SUSP: 20.0000 mL | Freq: Once | INTRAMUSCULAR | Status: DC

## 2021-09-16 MED ORDER — SODIUM CHLORIDE 0.9 % IV SOLN
2.0000 g | INTRAVENOUS | Status: AC
  Administered 2021-09-16: 2 g via INTRAVENOUS
  Filled 2021-09-16: qty 2

## 2021-09-16 MED ORDER — ROCURONIUM BROMIDE 10 MG/ML (PF) SYRINGE
PREFILLED_SYRINGE | INTRAVENOUS | Status: AC
  Filled 2021-09-16: qty 10

## 2021-09-16 MED ORDER — OXYCODONE HCL 5 MG/5ML PO SOLN: 5.0000 mg | Freq: Once | ORAL | Status: DC | PRN

## 2021-09-16 MED ORDER — ALUM & MAG HYDROXIDE-SIMETH 200-200-20 MG/5ML PO SUSP: 30.0000 mL | Freq: Four times a day (QID) | ORAL | Status: DC | PRN

## 2021-09-16 MED ORDER — PANTOPRAZOLE SODIUM 40 MG PO TBEC
40.0000 mg | DELAYED_RELEASE_TABLET | Freq: Two times a day (BID) | ORAL | Status: DC
  Administered 2021-09-16 – 2021-09-19 (×7): 40 mg via ORAL
  Filled 2021-09-16 (×7): qty 1

## 2021-09-16 MED ORDER — ONDANSETRON HCL 4 MG PO TABS: 4.0000 mg | ORAL_TABLET | Freq: Four times a day (QID) | ORAL | Status: DC | PRN

## 2021-09-16 MED ORDER — FENTANYL CITRATE (PF) 100 MCG/2ML IJ SOLN
INTRAMUSCULAR | Status: DC | PRN
  Administered 2021-09-16 (×2): 50 ug via INTRAVENOUS

## 2021-09-16 MED ORDER — DIAZEPAM 5 MG PO TABS
5.0000 mg | ORAL_TABLET | Freq: Two times a day (BID) | ORAL | Status: DC | PRN
  Administered 2021-09-18: 5 mg via ORAL
  Filled 2021-09-16: qty 1

## 2021-09-16 MED ORDER — BUPIVACAINE LIPOSOME 1.3 % IJ SUSP
INTRAMUSCULAR | Status: DC | PRN
  Administered 2021-09-16: 20 mL

## 2021-09-16 MED ORDER — PHENYLEPHRINE HCL-NACL 20-0.9 MG/250ML-% IV SOLN
INTRAVENOUS | Status: DC | PRN
  Administered 2021-09-16: 50 ug/min via INTRAVENOUS

## 2021-09-16 MED ORDER — PHENYLEPHRINE HCL (PRESSORS) 10 MG/ML IV SOLN
INTRAVENOUS | Status: AC
  Filled 2021-09-16: qty 1

## 2021-09-16 MED ORDER — LIDOCAINE HCL (PF) 2 % IJ SOLN
INTRAMUSCULAR | Status: DC | PRN
  Administered 2021-09-16: .96 mg/kg/h via INTRADERMAL

## 2021-09-16 MED ORDER — ACETAMINOPHEN 500 MG PO TABS
1000.0000 mg | ORAL_TABLET | Freq: Four times a day (QID) | ORAL | Status: DC
  Administered 2021-09-16 – 2021-09-19 (×10): 1000 mg via ORAL
  Filled 2021-09-16 (×10): qty 2

## 2021-09-16 MED ORDER — SUGAMMADEX SODIUM 200 MG/2ML IV SOLN
INTRAVENOUS | Status: DC | PRN
  Administered 2021-09-16: 200 mg via INTRAVENOUS

## 2021-09-16 MED ORDER — TRAMADOL HCL 50 MG PO TABS: 50.0000 mg | ORAL_TABLET | Freq: Four times a day (QID) | ORAL | 0 refills | Status: AC | PRN

## 2021-09-16 MED ORDER — LACTATED RINGERS IR SOLN
Status: DC | PRN
  Administered 2021-09-16: 1000 mL

## 2021-09-16 MED ORDER — PROPOFOL 10 MG/ML IV BOLUS
INTRAVENOUS | Status: AC
  Filled 2021-09-16: qty 20

## 2021-09-16 MED ORDER — HEPARIN SODIUM (PORCINE) 5000 UNIT/ML IJ SOLN
5000.0000 [IU] | Freq: Once | INTRAMUSCULAR | Status: AC
  Administered 2021-09-16: 5000 [IU] via SUBCUTANEOUS
  Filled 2021-09-16: qty 1

## 2021-09-16 MED ORDER — SIMVASTATIN 20 MG PO TABS
20.0000 mg | ORAL_TABLET | Freq: Every evening | ORAL | Status: DC
Start: 2021-09-16 — End: 2021-09-19
  Administered 2021-09-16 – 2021-09-18 (×3): 20 mg via ORAL
  Filled 2021-09-16 (×3): qty 1

## 2021-09-16 MED ORDER — ORAL CARE MOUTH RINSE: 15.0000 mL | Freq: Once | OROMUCOSAL | Status: AC

## 2021-09-16 MED ORDER — ENSURE PRE-SURGERY PO LIQD
296.0000 mL | Freq: Once | ORAL | Status: DC
  Filled 2021-09-16: qty 296

## 2021-09-16 MED ORDER — DIPHENHYDRAMINE HCL 50 MG/ML IJ SOLN: 12.5000 mg | Freq: Four times a day (QID) | INTRAMUSCULAR | Status: DC | PRN

## 2021-09-16 MED ORDER — SUCCINYLCHOLINE CHLORIDE 200 MG/10ML IV SOSY
PREFILLED_SYRINGE | INTRAVENOUS | Status: DC | PRN
  Administered 2021-09-16: 140 mg via INTRAVENOUS

## 2021-09-16 MED ORDER — OXYCODONE HCL 5 MG PO TABS: 5.0000 mg | ORAL_TABLET | Freq: Once | ORAL | Status: DC | PRN

## 2021-09-16 MED ORDER — SIMETHICONE 80 MG PO CHEW: 40.0000 mg | CHEWABLE_TABLET | Freq: Four times a day (QID) | ORAL | Status: DC | PRN

## 2021-09-16 MED ORDER — PROPOFOL 10 MG/ML IV BOLUS
INTRAVENOUS | Status: DC | PRN
  Administered 2021-09-16: 125 mg via INTRAVENOUS

## 2021-09-16 MED ORDER — EPHEDRINE SULFATE (PRESSORS) 50 MG/ML IJ SOLN
INTRAMUSCULAR | Status: DC | PRN
  Administered 2021-09-16: 10 mg via INTRAVENOUS
  Administered 2021-09-16: 15 mg via INTRAVENOUS

## 2021-09-16 MED ORDER — ROCURONIUM BROMIDE 10 MG/ML (PF) SYRINGE
PREFILLED_SYRINGE | INTRAVENOUS | Status: DC | PRN
  Administered 2021-09-16: 20 mg via INTRAVENOUS
  Administered 2021-09-16: 50 mg via INTRAVENOUS
  Administered 2021-09-16 (×2): 20 mg via INTRAVENOUS

## 2021-09-16 MED ORDER — MIDAZOLAM HCL 5 MG/5ML IJ SOLN
INTRAMUSCULAR | Status: DC | PRN
  Administered 2021-09-16: 1 mg via INTRAVENOUS

## 2021-09-16 MED ORDER — ONDANSETRON HCL 4 MG/2ML IJ SOLN
INTRAMUSCULAR | Status: AC
  Filled 2021-09-16: qty 2

## 2021-09-16 MED ORDER — MIDAZOLAM HCL 2 MG/2ML IJ SOLN
INTRAMUSCULAR | Status: AC
  Filled 2021-09-16: qty 2

## 2021-09-16 MED ORDER — DORZOLAMIDE HCL-TIMOLOL MAL 2-0.5 % OP SOLN
1.0000 [drp] | Freq: Two times a day (BID) | OPHTHALMIC | Status: DC
  Administered 2021-09-16 – 2021-09-19 (×7): 1 [drp] via OPHTHALMIC
  Filled 2021-09-16: qty 10

## 2021-09-16 MED ORDER — VITAMIN D 25 MCG (1000 UNIT) PO TABS
1000.0000 [IU] | ORAL_TABLET | Freq: Every day | ORAL | Status: DC
  Administered 2021-09-16 – 2021-09-19 (×4): 1000 [IU] via ORAL
  Filled 2021-09-16 (×4): qty 1

## 2021-09-16 MED ORDER — BUPIVACAINE-EPINEPHRINE (PF) 0.25% -1:200000 IJ SOLN
INTRAMUSCULAR | Status: AC
  Filled 2021-09-16: qty 30

## 2021-09-16 MED ORDER — DIPHENHYDRAMINE HCL 12.5 MG/5ML PO ELIX: 12.5000 mg | ORAL_SOLUTION | Freq: Four times a day (QID) | ORAL | Status: DC | PRN

## 2021-09-16 MED ORDER — HYDROMORPHONE HCL 1 MG/ML IJ SOLN
0.2500 mg | INTRAMUSCULAR | Status: DC | PRN
  Administered 2021-09-16: 0.25 mg via INTRAVENOUS

## 2021-09-16 MED ORDER — HEPARIN SODIUM (PORCINE) 5000 UNIT/ML IJ SOLN
5000.0000 [IU] | Freq: Three times a day (TID) | INTRAMUSCULAR | Status: DC
  Administered 2021-09-16 – 2021-09-19 (×8): 5000 [IU] via SUBCUTANEOUS
  Filled 2021-09-16 (×8): qty 1

## 2021-09-16 MED ORDER — LIDOCAINE 2% (20 MG/ML) 5 ML SYRINGE
INTRAMUSCULAR | Status: DC | PRN
  Administered 2021-09-16: 60 mg via INTRAVENOUS

## 2021-09-16 MED ORDER — METRONIDAZOLE 500 MG PO TABS: 1000.0000 mg | ORAL_TABLET | ORAL | Status: DC

## 2021-09-16 MED ORDER — KETAMINE HCL 10 MG/ML IJ SOLN
INTRAMUSCULAR | Status: DC | PRN
  Administered 2021-09-16: 34 mg via INTRAVENOUS
  Administered 2021-09-16: 20 mg via INTRAVENOUS

## 2021-09-16 MED ORDER — BUPIVACAINE-EPINEPHRINE 0.25% -1:200000 IJ SOLN
INTRAMUSCULAR | Status: DC | PRN
  Administered 2021-09-16: 30 mL

## 2021-09-16 MED ORDER — DEXAMETHASONE SODIUM PHOSPHATE 10 MG/ML IJ SOLN
INTRAMUSCULAR | Status: AC
  Filled 2021-09-16: qty 1

## 2021-09-16 MED ORDER — FENOFIBRATE 160 MG PO TABS
160.0000 mg | ORAL_TABLET | Freq: Every day | ORAL | Status: DC
  Administered 2021-09-17 – 2021-09-19 (×3): 160 mg via ORAL
  Filled 2021-09-16 (×3): qty 1

## 2021-09-16 MED ORDER — CHLORHEXIDINE GLUCONATE 0.12 % MT SOLN
15.0000 mL | Freq: Once | OROMUCOSAL | Status: AC
  Administered 2021-09-16: 15 mL via OROMUCOSAL

## 2021-09-16 MED ORDER — ALVIMOPAN 12 MG PO CAPS
12.0000 mg | ORAL_CAPSULE | ORAL | Status: AC
  Administered 2021-09-16: 12 mg via ORAL
  Filled 2021-09-16: qty 1

## 2021-09-16 MED ORDER — ENSURE SURGERY PO LIQD
237.0000 mL | Freq: Two times a day (BID) | ORAL | Status: DC
  Administered 2021-09-17 – 2021-09-19 (×4): 237 mL via ORAL

## 2021-09-16 MED ORDER — ONDANSETRON HCL 4 MG/2ML IJ SOLN: 4.0000 mg | Freq: Once | INTRAMUSCULAR | Status: DC | PRN

## 2021-09-16 MED ORDER — KETAMINE HCL 10 MG/ML IJ SOLN
INTRAMUSCULAR | Status: AC
  Filled 2021-09-16: qty 1

## 2021-09-16 MED ORDER — ACETAMINOPHEN 500 MG PO TABS
1000.0000 mg | ORAL_TABLET | ORAL | Status: AC
  Administered 2021-09-16: 1000 mg via ORAL
  Filled 2021-09-16: qty 2

## 2021-09-16 MED ORDER — IBUPROFEN 400 MG PO TABS: 600.0000 mg | ORAL_TABLET | Freq: Four times a day (QID) | ORAL | Status: DC | PRN

## 2021-09-16 MED ORDER — ENSURE PRE-SURGERY PO LIQD
592.0000 mL | Freq: Once | ORAL | Status: DC
  Filled 2021-09-16: qty 592

## 2021-09-16 MED ORDER — ATROPINE SULFATE 0.4 MG/ML IV SOLN
INTRAVENOUS | Status: DC | PRN
  Administered 2021-09-16: .2 mg via INTRAVENOUS

## 2021-09-16 MED ORDER — ALVIMOPAN 12 MG PO CAPS
12.0000 mg | ORAL_CAPSULE | Freq: Two times a day (BID) | ORAL | Status: DC
  Administered 2021-09-17: 12 mg via ORAL
  Filled 2021-09-16: qty 1

## 2021-09-16 SURGICAL SUPPLY — 89 items
ADH SKN CLS APL DERMABOND .7 (GAUZE/BANDAGES/DRESSINGS) ×4
APL PRP STRL LF DISP 70% ISPRP (MISCELLANEOUS) ×2
APL SKNCLS STERI-STRIP NONHPOA (GAUZE/BANDAGES/DRESSINGS)
APPLIER CLIP ROT 10 11.4 M/L (STAPLE)
APR CLP MED LRG 11.4X10 (STAPLE)
BAG COUNTER SPONGE SURGICOUNT (BAG) IMPLANT
BAG SPNG CNTER NS LX DISP (BAG)
BENZOIN TINCTURE PRP APPL 2/3 (GAUZE/BANDAGES/DRESSINGS) IMPLANT
BINDER BREAST LRG (GAUZE/BANDAGES/DRESSINGS) IMPLANT
BINDER BREAST XLRG (GAUZE/BANDAGES/DRESSINGS) IMPLANT
BLADE CLIPPER SURG (BLADE) IMPLANT
BLADE SURG 15 STRL LF DISP TIS (BLADE) ×2 IMPLANT
BLADE SURG 15 STRL SS (BLADE) ×3
CABLE HIGH FREQUENCY MONO STRZ (ELECTRODE) ×6 IMPLANT
CELLS DAT CNTRL 66122 CELL SVR (MISCELLANEOUS) IMPLANT
CHLORAPREP W/TINT 26 (MISCELLANEOUS) ×3 IMPLANT
CLIP APPLIE ROT 10 11.4 M/L (STAPLE) IMPLANT
DERMABOND ADVANCED (GAUZE/BANDAGES/DRESSINGS) ×2
DERMABOND ADVANCED .7 DNX12 (GAUZE/BANDAGES/DRESSINGS) IMPLANT
DISSECTOR BLUNT TIP ENDO 5MM (MISCELLANEOUS) IMPLANT
DRAPE LAPAROTOMY T 102X78X121 (DRAPES) IMPLANT
ELECT REM PT RETURN 15FT ADLT (MISCELLANEOUS) ×3 IMPLANT
GAUZE PACKING IODOFORM 1/4X15 (PACKING) IMPLANT
GAUZE SPONGE 4X4 12PLY STRL (GAUZE/BANDAGES/DRESSINGS) ×3 IMPLANT
GLOVE BIO SURGEON STRL SZ7.5 (GLOVE) ×6 IMPLANT
GLOVE BIOGEL PI IND STRL 8 (GLOVE) ×2 IMPLANT
GLOVE BIOGEL PI INDICATOR 8 (GLOVE) ×1
GLOVE ECLIPSE 8.0 STRL XLNG CF (GLOVE) ×6 IMPLANT
GOWN STRL REUS W/ TWL XL LVL3 (GOWN DISPOSABLE) ×8 IMPLANT
GOWN STRL REUS W/TWL XL LVL3 (GOWN DISPOSABLE) ×12
IRRIG SUCT STRYKERFLOW 2 WTIP (MISCELLANEOUS)
IRRIGATION SUCT STRKRFLW 2 WTP (MISCELLANEOUS) IMPLANT
KIT BASIN OR (CUSTOM PROCEDURE TRAY) ×3 IMPLANT
KIT TURNOVER KIT A (KITS) IMPLANT
LIGASURE IMPACT 36 18CM CVD LR (INSTRUMENTS) IMPLANT
MARKER SKIN DUAL TIP RULER LAB (MISCELLANEOUS) ×3 IMPLANT
NDL INSUFFLATION 14GA 150MM (NEEDLE) IMPLANT
NEEDLE HYPO 22GX1.5 SAFETY (NEEDLE) ×3 IMPLANT
NEEDLE INSUFFLATION 14GA 150MM (NEEDLE) ×3 IMPLANT
NS IRRIG 1000ML POUR BTL (IV SOLUTION) ×3 IMPLANT
PACK COLON (CUSTOM PROCEDURE TRAY) ×3 IMPLANT
PACK GENERAL/GYN (CUSTOM PROCEDURE TRAY) ×3 IMPLANT
PAD POSITIONING PINK XL (MISCELLANEOUS) IMPLANT
PAD TELFA 2X3 NADH STRL (GAUZE/BANDAGES/DRESSINGS) IMPLANT
PENCIL SMOKE EVACUATOR (MISCELLANEOUS) IMPLANT
PROTECTOR NERVE ULNAR (MISCELLANEOUS) IMPLANT
RELOAD PROXIMATE 75MM BLUE (ENDOMECHANICALS) ×9 IMPLANT
RELOAD STAPLE 75 3.8 BLU REG (ENDOMECHANICALS) IMPLANT
RETRACTOR WND ALEXIS 18 MED (MISCELLANEOUS) IMPLANT
RTRCTR WOUND ALEXIS 18CM MED (MISCELLANEOUS)
SCISSORS LAP 5X35 DISP (ENDOMECHANICALS) ×3 IMPLANT
SEALER TISSUE G2 STRG ARTC 35C (ENDOMECHANICALS) IMPLANT
SET TUBE SMOKE EVAC HIGH FLOW (TUBING) ×3 IMPLANT
SHEARS HARMONIC ACE PLUS 36CM (ENDOMECHANICALS) IMPLANT
SLEEVE ADV FIXATION 5X100MM (TROCAR) ×9 IMPLANT
SPIKE FLUID TRANSFER (MISCELLANEOUS) ×3 IMPLANT
SPONGE T-LAP 4X18 ~~LOC~~+RFID (SPONGE) ×3 IMPLANT
STAPLER 90 3.5 STAND SLIM (STAPLE) ×3
STAPLER 90 3.5 STD SLIM (STAPLE) IMPLANT
STAPLER GUN LINEAR PROX 60 (STAPLE) IMPLANT
STAPLER PROXIMATE 75MM BLUE (STAPLE) ×1 IMPLANT
STAPLER VISISTAT 35W (STAPLE) ×3 IMPLANT
SUT MNCRL AB 4-0 PS2 18 (SUTURE) ×3 IMPLANT
SUT PDS AB 1 CT1 27 (SUTURE) ×6 IMPLANT
SUT PROLENE 2 0 CT2 30 (SUTURE) IMPLANT
SUT PROLENE 2 0 KS (SUTURE) IMPLANT
SUT SILK 2 0 (SUTURE) ×3
SUT SILK 2 0 SH CR/8 (SUTURE) ×3 IMPLANT
SUT SILK 2-0 18XBRD TIE 12 (SUTURE) ×2 IMPLANT
SUT SILK 3 0 (SUTURE) ×3
SUT SILK 3 0 SH CR/8 (SUTURE) ×3 IMPLANT
SUT SILK 3-0 18XBRD TIE 12 (SUTURE) ×2 IMPLANT
SUT VIC AB 4-0 SH 18 (SUTURE) IMPLANT
SYR 20ML LL LF (SYRINGE) ×3 IMPLANT
SYS LAPSCP GELPORT 120MM (MISCELLANEOUS)
SYS WOUND ALEXIS 18CM MED (MISCELLANEOUS) ×3
SYSTEM LAPSCP GELPORT 120MM (MISCELLANEOUS) IMPLANT
SYSTEM WOUND ALEXIS 18CM MED (MISCELLANEOUS) ×2 IMPLANT
TAPE CLOTH 4X10 WHT NS (GAUZE/BANDAGES/DRESSINGS) IMPLANT
TOWEL OR 17X26 10 PK STRL BLUE (TOWEL DISPOSABLE) ×3 IMPLANT
TRAY FOLEY MTR SLVR 14FR STAT (SET/KITS/TRAYS/PACK) ×3 IMPLANT
TRAY FOLEY MTR SLVR 16FR STAT (SET/KITS/TRAYS/PACK) IMPLANT
TROCAR ADV FIXATION 5X100MM (TROCAR) ×3 IMPLANT
TROCAR BALLN 12MMX100 BLUNT (TROCAR) ×3 IMPLANT
TROCAR KII 12X100 BLADELESS (ENDOMECHANICALS) ×3 IMPLANT
TROCAR XCEL NON-BLD 11X100MML (ENDOMECHANICALS) IMPLANT
TROCAR Z-THREAD FIOS 5X100MM (TROCAR) ×1 IMPLANT
TUBING CONNECTING 10 (TUBING) ×6 IMPLANT
YANKAUER SUCT BULB TIP NO VENT (SUCTIONS) ×3 IMPLANT

## 2021-09-16 NOTE — Anesthesia Postprocedure Evaluation (Signed)
Anesthesia Post Note ? ?Patient: [name] ? ?Procedure(s) Performed: LAPAROSCOPIC EXTENDED RIGHT HEMI COLECTOMY (Right) ?EXCISION MASS LEFT CHEST WALL (Left) ? ?  ? ?Patient location during evaluation: PACU ?Anesthesia Type: General ?Level of consciousness: awake and alert ?Pain management: pain level controlled ?Vital Signs Assessment: post-procedure vital signs reviewed and stable ?Respiratory status: spontaneous breathing, nonlabored ventilation, respiratory function stable and patient connected to nasal cannula oxygen ?Cardiovascular status: blood pressure returned to baseline and stable ?Postop Assessment: no apparent nausea or vomiting ?Anesthetic complications: no ? ? ?No notable events documented. ? ?Last Vitals:  ?Vitals:  ? 09/16/21 1110 09/16/21 1115  ?BP: 124/70 121/66  ?Pulse: 63 64  ?Resp: (!) 21 15  ?Temp: 36.7 ?C   ?SpO2: 100% 100%  ?  ?Last Pain:  ?Vitals:  ? 09/16/21 0612  ?TempSrc: Oral  ?PainSc:   ? ? ?  ?  ?  ?  ?  ?  ? ?[name] ? ? ? ? ?

## 2021-09-16 NOTE — Addendum Note (Signed)
Addendum  created 09/16/21 1250 by Bonney Aid, CRNA  ? Charge Capture section accepted, Visit diagnoses modified  ?  ?

## 2021-09-16 NOTE — Op Note (Addendum)
PATIENT: [name]  76 y.o. male  Patient Care Team: Lupita Raider, MD as PCP - General (Family Medicine)  PREOP DIAGNOSIS:  Transverse colon cancer Left chest wall mass, subcutaneous  POSTOP DIAGNOSIS: Same  PROCEDURE:  Extended laparoscopic right hemicolectomy with ileo-transverse anastomosis Excision of 1.5 x 1.5 cm subcutaneous mass from left chest wall  SURGEON: Marin Olp MD  ASSISTANT: Phylliss Blakes MD  ANESTHESIA: General endotracheal  EBL: 100 mL Total I/O In: 1200 [I.V.:1100; IV Piggyback:100] Out: 725 [Urine:625; Blood:100]  DRAINS: None  SPECIMEN:  Right colon (including appendix, terminal ileum, proximal and mid transverse colon) Left chest wall mass  COUNTS: Sponge, needle and instrument counts were reported correct x2  FINDINGS:  Tattoo ultimately apparent in the mid transverse colon.  This was just to the right of the main branch of the middle colic pedicle.  We were therefore able to obtain adequate oncologic resection with an extended right hemicolectomy.  This included the right branch of the middle colic's and went out to the mid transverse colon.  The tattoo was noted to be distal to the apparent mass in his colon.  Good blood flow in the remaining transverse colon as demonstrated with a Doppler.  No obvious metastatic disease on visceral parietal peritoneum or liver.  Left chest wall subcutaneous mass which measures approximately 1.5 x 1.5 cm and was purplish in color was fully excised and submitted for pathology.  NARRATIVE:  The patient was identified & brought into the operating room, placed supine on the operating table and SCDs were applied to the lower extremities. General endotracheal anesthesia was induced. The patient was positioned supine with arms tucked. Antibiotics were administered. A foley catheter was placed under sterile conditions. Hair in the region of planned surgery was clipped. The abdomen was prepped and draped in a  sterile fashion. A timeout was performed confirming our patient and plan.   Given his surgical history, we opted to proceed with a Veress needle entry for insufflation.  An OG tube had been placed by anesthesia and was confirmed to be to suction.  At Palmer's point, a stab incision was created and the Veress needle introduced into the peritoneal cavity on the first attempt.  Pneumoperitoneum was then established with CO2 to a pressure of 15 mmHg.  At this location, a 5 mm optical viewing trocar was placed under direct realization into the peritoneal cavity.  The laparoscope was inserted and demonstrated no evidence of Veress needle or trocar site complications.  The abdomen is surveyed.  He has primarily omental containing adhesions across much of his midline.  At the inferior most aspect, there is some loosely adherent sigmoid colon.  2 additional 5 mm trocars were then placed in the left hemiabdomen under direct visualization.  We began with adhesiolysis.  This was done sharply taking down all omentum across the midline.  The sigmoid colon was freed sharply from the low midline.  He does have numerous Swiss cheese type defects in his lower abdominal wall from his prior surgery.  These are wide necked defects.  There is no bowel within this.  After complete adhesiolysis, we performed bilateral transversus abdominis plane blocks using a dilute mixture of Exparel with quarter percent Marcaine/epinephrine.  An additional 5 mm assist port was then placed in the right hemiabdomen.  This under direct visualization.  The abdomen was surveyed. The liver and peritoneum appeared normal.  There were no signs of metastatic disease.  Ultimately, we are able to identify a  tattoo within the midportion of the transverse colon and a mass proximal to this.  There is no evident full-thickness extension per se.  The patient was positioned in trendelenburg with left side down.  We began with a lateral to medial approach.  The  cecum was grasped and retracted medially.  The Townes Fuhs line of Toldt was incised reflecting the cecum and ascending colon medially all the way up to the level of the hepatic flexure.  The associated mesocolon was reflected medially.  He is then repositioned in reverse Trendelenburg.  The omentum was retracted anteriorly and the colon retracted inferiorly.  The lesser sac is readily gained.  The mid and proximal transverse colon are then mobilized maintaining this plane.  The hepatic flexure was then fully mobilized.  The duodenum was readily identified and the overlying mesocolon carefully separated.  He has a fairly friable mesocolon due to the adiposity and weight of the structures.  We therefore opted to complete the mesenteric ligation portion of the surgery extracorporeally.  The extraction incision was then created in the supraumbilical midline.  The fascia was incised in the midline.  An Alexis wound protector was placed. The terminal ileum and right colon delivered through the wound protector. The terminal ileum was then transected using a GIA blue load stapler and a Babcock placed on the proximal staple line to assist in maintaining orientation.  The distal point of transection is identified on the level of the mid transverse colon overlying the middle colic pedicle.  We wanted to include the right branch of the middle colic's for purposes of oncology.  5 cm distal to the mass, a window was created in the mesentery and the colon was divided with a GIA blue load stapler.  The mesentery was then clamped/ligated and divided using the Enseal device.  We included the right branch of the middle colic's.  The ileocolic pedicle was also taken near its origin.  The specimen was then passed off.  The mesentery is inspected and noted to be hemostatic.  Some of the omentum is clearly not viable and is therefore excised using the Enseal device.  This was included with the colon specimen.  Attention was turned to  creating the anastomosis.  The remaining transverse colon is inspected.  It is pink in color.  There is a strong Doppler signal within the mesentery as well as a palpable pulse.  The distal ileum was also inspected.  There is a strong pulse in the mesentery going out to the divided section.  There is also a audible pulse in the mesentery using the Doppler.  The terminal ileum and transverse colon were inspected for orientation to ensure no twisting nor bowel included in the mesenteric defect. An anastomosis was created between the terminal ileum and the transverse colon using a 75 mm GIA blue load stapler. The staple line was inspected and noted to be hemostatic.  The common enterotomy channel was closed using a TA 90 blue load stapler. Hemostasis was achieved at the staple line using 3-0 silk U-stitches. 3-0 silk sutures were used to imbricate the corners of the staple line as well.  A 2-0 silk suture was placed securing the "crotch" of the anastomosis. The anastomosis was palpated and noted to be widely patent. This was then placed back into the abdomen. The abdomen was then irrigated with sterile saline and hemostasis verified. The omentum was then brought down over the anastomosis. The wound protector cap was replaced and Co2 reinsufflated. The laparoscopic  ports were removed under direct visualization and the sites noted to be hemostatic. The Alexis wound protector was removed, counts were reported correct, and we switched to clean instruments, gowns and drapes.   The fascia was then closed using two running #1 PDS sutures.  The skin of all incision sites was closed with 4-0 monocryl subcuticular suture. Dermabond was placed on the port sites and a sterile dressing was placed over the abdominal incision. All counts were reported correct.  We then directed our attention to the left chest wall mass.  All drapes were taken down.  Gown/gloves were changed.  The clean instruments were utilized.  I scrubbed back  in.  The left chest had been prepped and draped in a standard sterile manner.  The nodule is clearly palpable on exam.  Local anesthetic is infiltrated.  The skin overlying this mass is incised and the subcutaneous tissue divided electrocautery.  The mass is able to be delivered onto the field and the base of it completely excised using electrocautery.  The pocket is irrigated and hemostasis appreciated.  The mass is passed off the specimen.  All counts of this portion the procedure correct.  The skin is then closed with a running 4-0 Monocryl subcuticular suture.  Dermabond is then applied after the wound is washed and dried.  He was then awakened from anesthesia and sent to the post anesthesia care unit in stable condition.    DISPOSITION: PACU in satisfactory condition

## 2021-09-16 NOTE — Anesthesia Preprocedure Evaluation (Signed)
Anesthesia Evaluation  ?Patient identified by MRN, date of birth, ID band ?Patient awake ? ? ? ?Reviewed: ?Allergy & Precautions, NPO status , Patient's Chart, lab work & pertinent test results ? ?Airway ?Mallampati: II ? ?TM Distance: >3 FB ?Neck ROM: Full ? ? ? Dental ?no notable dental hx. ? ?  ?Pulmonary ?sleep apnea and Continuous Positive Airway Pressure Ventilation , former smoker,  ?  ?Pulmonary exam normal ?breath sounds clear to auscultation ? ? ? ? ? ? Cardiovascular ?hypertension, Normal cardiovascular exam ?Rhythm:Regular Rate:Normal ? ? ?  ?Neuro/Psych ?Anxiety negative neurological ROS ?   ? GI/Hepatic ?negative GI ROS, Neg liver ROS,   ?Endo/Other  ?negative endocrine ROS ? Renal/GU ?negative Renal ROS  ?negative genitourinary ?  ?Musculoskeletal ?negative musculoskeletal ROS ?(+)  ? Abdominal ?  ?Peds ?negative pediatric ROS ?(+)  Hematology ?negative hematology ROS ?(+)   ?Anesthesia Other Findings ? ? Reproductive/Obstetrics ?negative OB ROS ? ?  ? ? ? ? ? ? ? ? ? ? ? ? ? ?  ?  ? ? ? ? ? ? ? ? ?Anesthesia Physical ?Anesthesia Plan ? ?ASA: 2 ? ?Anesthesia Plan: General  ? ?Post-op Pain Management: Tylenol PO (pre-op)*  ? ?Induction: Intravenous ? ?PONV Risk Score and Plan: 2 and Ondansetron, Dexamethasone and Treatment may vary due to age or medical condition ? ?Airway Management Planned: Oral ETT ? ?Additional Equipment:  ? ?Intra-op Plan:  ? ?Post-operative Plan: Extubation in OR ? ?Informed Consent: I have reviewed the patients History and Physical, chart, labs and discussed the procedure including the risks, benefits and alternatives for the proposed anesthesia with the patient or authorized representative who has indicated his/her understanding and acceptance.  ? ? ? ?Dental advisory given ? ?Plan Discussed with: CRNA and Surgeon ? ?Anesthesia Plan Comments:   ? ? ? ? ? ? ?Anesthesia Quick Evaluation ? ?

## 2021-09-16 NOTE — Anesthesia Procedure Notes (Signed)
Procedure Name: Intubation ?Date/Time: 09/16/2021 7:51 AM ?Performed by: Bonney Aid, CRNA ?Pre-anesthesia Checklist: Patient identified, Emergency Drugs available, Suction available and Patient being monitored ?Patient Re-evaluated:Patient Re-evaluated prior to induction ?Oxygen Delivery Method: Circle system utilized ?Preoxygenation: Pre-oxygenation with 100% oxygen ?Induction Type: IV induction ?Ventilation: Mask ventilation without difficulty ?Laryngoscope Size: Mac and 4 ?Grade View: Grade I ?Tube type: Oral ?Tube size: 8.0 mm ?Number of attempts: 1 ?Airway Equipment and Method: Stylet and Oral airway ?Placement Confirmation: ETT inserted through vocal cords under direct vision, positive ETCO2 and breath sounds checked- equal and bilateral ?Secured at: 22 cm ?Tube secured with: Tape ?Dental Injury: Teeth and Oropharynx as per pre-operative assessment  ? ? ? ? ?

## 2021-09-16 NOTE — Transfer of Care (Signed)
Immediate Anesthesia Transfer of Care Note ? ?Patient: [name] ? ?Procedure(s) Performed: LAPAROSCOPIC EXTENDED RIGHT HEMI COLECTOMY (Right) ?EXCISION MASS LEFT CHEST WALL (Left) ? ?Patient Location: PACU ? ?Anesthesia Type:General ? ?Level of Consciousness: sedated ? ?Airway & Oxygen Therapy: Patient Spontanous Breathing and Patient connected to face mask oxygen ? ?Post-op Assessment: Report given to RN ? ?Post vital signs: Reviewed and stable ? ?Last Vitals:  ?Vitals Value Taken Time  ?BP 124/70 09/16/21 1110  ?Temp    ?Pulse 64 09/16/21 1115  ?Resp 15 09/16/21 1115  ?SpO2 100 % 09/16/21 1115  ?Vitals shown include unvalidated device data. ? ?Last Pain:  ?Vitals:  ? 09/16/21 0612  ?TempSrc: Oral  ?PainSc:   ?   ? ?  ? ?Complications: No notable events documented. ?

## 2021-09-16 NOTE — H&P (Signed)
? ?CC: Here today for surgery ? ?HPI: ?[name] is an 76 y.o. male with history of HTN, HLD, DM, whom is seen in the office today as a referral by Dr. Alessandra Bevels for evaluation of transverse colon mass. He underwent colonoscopy with Dr. Alessandra Bevels 08/03/2021 where he was found to have: ? ?1 cm polyp in the cecum, removed. ?1.2 cm polyp in the ascending colon, removed "incomplete resection". ?1 cm polyp in the transverse colon, removed. ?Malignant appearing tumor in the transverse colon, biopsy, tattooed. ?Diverticulosis ?Patent end-to-side colocolonic anastomosis, location was not noted at his colonoscopy by his endoscopist ?5 mm polyp in the rectum, removed. Internal hemorrhoids ?Hypertrophied anal papilla. ? ?CT CAP 08/08/21 -left lower axillary lymph node/small chest wall nodule 1.1 cm. Not readily apparent on CT chest from 05/2009. Felt by radiologist to be potentially neoplastic. Its located 7/2 cm lateral to and just below the nipple ? ?Mildly complex ventral hernia along the abdomen containing fat although wider portion contains margin of transverse colon. ? ?He reports he has been doing well since his colonoscopy. He denies any abdominal pain, nausea, vomiting. He reports that his stools did have blood initially in early March but has had none since. ? ?Pathology confirmed adenocarcinoma - we were contacted by Rogelio Seen, whom relayed this information. ? ?He denies any changes in his health/heath hx since we met in the office. Tolerated prep with satisfactory result ? ?PMH: HTN, HLD, preDM ? ?PSH: Prior laparoscopic-assisted splenic flexure resection by Dr. Marlou Starks 06/01/2004. ? ?FHx: Denies any known family history of colorectal, breast, endometrial or ovarian cancer ? ?Social Hx: Denies use of tobacco/EtOH/illicit drug. He is retired ? ?Past Medical History:  ?Diagnosis Date  ? Anxiety   ? Cancer [facility])   ? colon cancer, skin cancer  ? Depression   ? Hyperlipidemia   ? Hypertension   ? Pre-diabetes   ?  Sleep apnea   ? cpap  ? ? ?Past Surgical History:  ?Procedure Laterality Date  ? bicep surgery Bilateral   ? CHOLECYSTECTOMY    ? COLON SURGERY    ? EYE SURGERY    ? KNEE SURGERY Left   ? SHOULDER SURGERY Left   ? ? ?History reviewed. No pertinent family history. ? ?Social:  reports that he has quit smoking. His smoking use included cigarettes and cigars. He has never used smokeless tobacco. He reports current alcohol use of about 9.0 standard drinks per week. He reports that he does not use drugs. ? ?Allergies:  ?Allergies  ?Allergen Reactions  ? Ibuprofen Other (See Comments)  ?  Blurred vision  ? ? ?Medications: I have reviewed the patient's current medications. ? ?Results for orders placed or performed during the hospital encounter of 09/16/21 (from the past 48 hour(s))  ?ABO/Rh     Status: None  ? Collection Time: 09/16/21  5:00 AM  ?Result Value Ref Range  ? ABO/RH(D)    ?  O POS ?Performed at [facility], [place] [address], [place], [place] [address] ?  ?Glucose, capillary     Status: Abnormal  ? Collection Time: 09/16/21  5:55 AM  ?Result Value Ref Range  ? Glucose-Capillary 122 (H) 70 - 99 mg/dL  ?  Comment: Glucose reference range applies only to samples taken after fasting for at least 8 hours.  ? ? ?No results found. ? ?ROS - all of the below systems have been reviewed with the patient and positives are indicated with bold text ?General: chills,  fever or night sweats ?Eyes: blurry vision or double vision ?ENT: epistaxis or sore throat ?Allergy/Immunology: itchy/watery eyes or nasal congestion ?Hematologic/Lymphatic: bleeding problems, blood clots or swollen lymph nodes ?Endocrine: temperature intolerance or unexpected weight changes ?Breast: new or changing breast lumps or nipple discharge ?Resp: cough, shortness of breath, or wheezing ?CV: chest pain or dyspnea on exertion ?GI: as per HPI ?GU: dysuria, trouble voiding, or hematuria ?MSK: joint pain or joint stiffness ?Neuro: TIA or  stroke symptoms ?Derm: pruritus and skin lesion changes ?Psych: anxiety and depression ? ?PE ?Blood pressure 132/62, pulse 60, temperature 98.2 ?F (36.8 ?C), temperature source Oral, resp. rate 18, weight 100 kg, SpO2 98 %. ?Constitutional: NAD; conversant ?Eyes: Moist conjunctiva; no lid lag; anicteric ?Lungs: Normal respiratory effort ?CV: RRR ?GI: Abd soft, NT/ND; no palpable hepatosplenomegaly ?MSK: Normal range of motion of extremities; no clubbing/cyanosis ?Psychiatric: Appropriate affect; alert and oriented x3 ? ?Results for orders placed or performed during the hospital encounter of 09/16/21 (from the past 48 hour(s))  ?ABO/Rh     Status: None  ? Collection Time: 09/16/21  5:00 AM  ?Result Value Ref Range  ? ABO/RH(D)    ?  O POS ?Performed at [facility], [place] [address], [place], [place] [address] ?  ?Glucose, capillary     Status: Abnormal  ? Collection Time: 09/16/21  5:55 AM  ?Result Value Ref Range  ? Glucose-Capillary 122 (H) 70 - 99 mg/dL  ?  Comment: Glucose reference range applies only to samples taken after fasting for at least 8 hours.  ? ? ?No results found. ? ? ?A/P: ?[name] is an 76 y.o. male with hx of HTN, HLD, DM, prior splenic flexure removal by Dr. Marlou Starks 2006 here for evaluation of newly diagnosed mass in the transverse colon on colonoscopy with Dr. Alessandra Bevels 07/2021. This was biopsied and tattooed. ? ?Pathology confirmed adenocarcinoma ? ?CT CAP 08/08/2021- intervally new nodule/mass left chest wall lateral to the nipple, palpable on exam today, approximately 1.5 x 1.5 cm, mobile, in the subcutaneous tissue. ? ?-The anatomy and physiology of the GI tract was reviewed with the patient. The pathophysiology of colon masses and cancer was discussed as well with associated pictures. ?-We have discussed various different treatment options going forward including surgery (the most definitive) to address this -laparoscopic assisted extended right hemicolectomy, excision  of left chest wall mass ? ?-The planned procedures, material risks (including, but not limited to, pain, bleeding, infection, scarring, need for blood transfusion, damage to surrounding structures- blood vessels/nerves/viscus/organs, damage to ureter, urine leak, leak from anastomosis, need for additional procedures, scenarios where a stoma may be necessary and where it may be permanent, worsening of pre-existing medical conditions, hernia, recurrence, pneumonia, heart attack, stroke, death) benefits and alternatives to surgery were discussed at length. The patient's questions were answered to his satisfaction, he voiced understanding and elected to proceed with surgery. Additionally, we discussed typical postoperative expectations and the recovery process.  ? ?Nadeen Landau, MD ?[facility], [facility] ?

## 2021-09-16 NOTE — Discharge Instructions (Signed)
POST OP INSTRUCTIONS AFTER COLON SURGERY  DIET: Be sure to include lots of fluids daily to stay hydrated - 64oz of water per day (8, 8 oz glasses).  Avoid fast food or heavy meals for the first couple of weeks as your are more likely to get nauseated. Avoid raw/uncooked fruits or vegetables for the first 4 weeks (its ok to have these if they are blended into smoothie form). If you have fruits/vegetables, make sure they are cooked until soft enough to mash on the roof of your mouth and chew your food well. Otherwise, diet as tolerated.  Take your usually prescribed home medications unless otherwise directed.  PAIN CONTROL: Pain is best controlled by a usual combination of three different methods TOGETHER: Ice/Heat Over the counter pain medication Prescription pain medication Most patients will experience some swelling and bruising around the surgical site.  Ice packs or heating pads (30-60 minutes up to 6 times a day) will help. Some people prefer to use ice alone, heat alone, alternating between ice & heat.  Experiment to what works for you.  Swelling and bruising can take several weeks to resolve.   It is helpful to take an over-the-counter pain medication regularly for the first few weeks: Ibuprofen (Motrin/Advil) - 200mg tabs - take 3 tabs (600mg) every 6 hours as needed for pain (unless you have been directed previously to avoid NSAIDs/ibuprofen) Acetaminophen (Tylenol) - you may take 650mg every 6 hours as needed. You can take this with motrin as they act differently on the body. If you are taking a narcotic pain medication that has acetaminophen in it, do not take over the counter tylenol at the same time. NOTE: You may take both of these medications together - most patients  find it most helpful when alternating between the two (i.e. Ibuprofen at 6am, tylenol at 9am, ibuprofen at 12pm ...) A  prescription for pain medication should be given to you upon discharge.  Take your pain medication as  prescribed if your pain is not adequatly controlled with the over-the-counter pain reliefs mentioned above.  Avoid getting constipated.  Between the surgery and the pain medications, it is common to experience some constipation.  Increasing fluid intake and taking a fiber supplement (such as Metamucil, Citrucel, FiberCon, MiraLax, etc) 1-2 times a day regularly will usually help prevent this problem from occurring.  A mild laxative (prune juice, Milk of Magnesia, MiraLax, etc) should be taken according to package directions if there are no bowel movements after 48 hours.    Dressing: Your incisions are covered in Dermabond which is like sterile superglue for the skin. This will come off on it's own in a couple weeks. It is waterproof and you may bathe normally starting the day after your surgery in a shower. Avoid baths/pools/lakes/oceans until your wounds have fully healed.  ACTIVITIES as tolerated:   Avoid heavy lifting (>10lbs or 1 gallon of milk) for the next 6 weeks. You may resume regular daily activities as tolerated--such as daily self-care, walking, climbing stairs--gradually increasing activities as tolerated.  If you can walk 30 minutes without difficulty, it is safe to try more intense activity such as jogging, treadmill, bicycling, low-impact aerobics.  DO NOT PUSH THROUGH PAIN.  Let pain be your guide: If it hurts to do something, don't do it. You may drive when you are no longer taking prescription pain medication, you can comfortably wear a seatbelt, and you can safely maneuver your car and apply brakes.  FOLLOW UP in our   office Please call [facility] at [phone] to set up an appointment to see your surgeon in the office for a follow-up appointment approximately 2 weeks after your surgery. Make sure that you call for this appointment the day you arrive home to insure a convenient appointment time.  9. If you have disability or family leave forms that need to be completed, you may have  them completed by your primary care physician's office; for return to work instructions, please ask our office staff and they will be happy to assist you in obtaining this documentation   When to call us [phone]: Poor pain control Reactions / problems with new medications (rash/itching, etc)  Fever over 101.5 F (38.5 C) Inability to urinate Nausea/vomiting Worsening swelling or bruising Continued bleeding from incision. Increased pain, redness, or drainage from the incision  The clinic staff is available to answer your questions during regular business hours (8:30am-5pm).  Please don't hesitate to call and ask to speak to one of our nurses for clinical concerns.   A surgeon from [facility] is always on call at the hospitals   If you have a medical emergency, go to the nearest emergency room or call 911.  [facility] [address], [address], [place], [place]  [address] MAIN: [phone] FAX: [phone] [email or web address]  

## 2021-09-17 ENCOUNTER — Encounter (HOSPITAL_COMMUNITY): Payer: Self-pay | Admitting: Surgery

## 2021-09-17 LAB — BASIC METABOLIC PANEL
Anion gap: 5 (ref 5–15)
BUN: 8 mg/dL (ref 8–23)
CO2: 27 mmol/L (ref 22–32)
Calcium: 8.7 mg/dL — ABNORMAL LOW (ref 8.9–10.3)
Chloride: 104 mmol/L (ref 98–111)
Creatinine, Ser: 0.89 mg/dL (ref 0.61–1.24)
GFR, Estimated: >60 mL/min (ref >60)
Glucose, Bld: 116 mg/dL — ABNORMAL HIGH (ref 70–99)
Potassium: 3.2 mmol/L — ABNORMAL LOW (ref 3.5–5.1)
Sodium: 136 mmol/L (ref 135–145)

## 2021-09-17 LAB — CBC
HCT: 33.6 % — ABNORMAL LOW (ref 39.0–52.0)
Hemoglobin: 10.8 g/dL — ABNORMAL LOW (ref 13.0–17.0)
MCH: 28.5 pg (ref 26.0–34.0)
MCHC: 32.1 g/dL (ref 30.0–36.0)
MCV: 88.7 fL (ref 80.0–100.0)
Platelets: 249 10*3/uL (ref 150–400)
RBC: 3.79 MIL/uL — ABNORMAL LOW (ref 4.22–5.81)
RDW: 14.5 % (ref 11.5–15.5)
WBC: 8 10*3/uL (ref 4.0–10.5)
nRBC: 0 % (ref 0.0–0.2)

## 2021-09-17 NOTE — Progress Notes (Signed)
1 Day Post-Op  ? ?Subjective/Chief Complaint: ?No complaints other than soreness ? ? ?Objective: ?Vital signs in last 24 hours: ?Temp:  [97.8 ?F (36.6 ?C)-98.7 ?F (37.1 ?C)] 98.1 ?F (36.7 ?C) (05/13 0814) ?Pulse Rate:  [51-84] 51 (05/13 0629) ?Resp:  [14-21] 18 (05/13 0629) ?BP: (98-129)/(55-84) 102/55 (05/13 4818) ?SpO2:  [92 %-100 %] 96 % (05/13 0629) ?Weight:  [99.9 kg] 99.9 kg (05/12 2011) ?  ? ?Intake/Output from previous day: ?05/12 0701 - 05/13 0700 ?In: 3077.6 [P.O.:750; I.V.:2227.6; IV Piggyback:100] ?Out: 2875 [Urine:2775; Blood:100] ?Intake/Output this shift: ?Total I/O ?In: 300 [P.O.:300] ?Out: -  ? ?General appearance: alert and cooperative ?Resp: clear to auscultation bilaterally ?Cardio: regular rate and rhythm ?GI: soft, appropriately tender. Incisions look good ? ?Lab Results:  ?Recent Labs  ?  09/17/21 ?5631  ?WBC 8.0  ?HGB 10.8*  ?HCT 33.6*  ?PLT 249  ? ?BMET ?Recent Labs  ?  09/17/21 ?0454  ?NA 136  ?K 3.2*  ?CL 104  ?CO2 27  ?GLUCOSE 116*  ?BUN 8  ?CREATININE 0.89  ?CALCIUM 8.7*  ? ?PT/INR ?No results for input(s): LABPROT, INR in the last 72 hours. ?ABG ?No results for input(s): PHART, HCO3 in the last 72 hours. ? ?Invalid input(s): PCO2, PO2 ? ?Studies/Results: ?No results found. ? ?Anti-infectives: ?Anti-infectives (From admission, onward)  ? ? Start     Dose/Rate Route Frequency Ordered Stop  ? 09/16/21 1400  neomycin (MYCIFRADIN) tablet 1,000 mg  Status:  Discontinued       ?See Hyperspace for full Linked Orders Report.  ? 1,000 mg Oral 3 times per day 09/16/21 0533 09/16/21 0539  ? 09/16/21 1400  metroNIDAZOLE (FLAGYL) tablet 1,000 mg  Status:  Discontinued       ?See Hyperspace for full Linked Orders Report.  ? 1,000 mg Oral 3 times per day 09/16/21 0533 09/16/21 0539  ? 09/16/21 0600  cefoTEtan (CEFOTAN) 2 g in sodium chloride 0.9 % 100 mL IVPB       ? 2 g ?200 mL/hr over 30 Minutes Intravenous On call to O.R. 09/16/21 0533 09/16/21 0751  ? ?  ? ? ?Assessment/Plan: ?s/p  Procedure(s): ?LAPAROSCOPIC EXTENDED RIGHT HEMI COLECTOMY (Right) ?EXCISION MASS LEFT CHEST WALL (Left) ?Advance diet as tolerated ?POD 1 R colectomy ?Ambulate ?Lovenox for VTE prophylaxis ? ? LOS: 1 day  ? ? ?Autumn Messing III ?09/17/2021 ? ?

## 2021-09-18 LAB — BASIC METABOLIC PANEL
Anion gap: 6 (ref 5–15)
BUN: 10 mg/dL (ref 8–23)
CO2: 29 mmol/L (ref 22–32)
Calcium: 9.3 mg/dL (ref 8.9–10.3)
Chloride: 106 mmol/L (ref 98–111)
Creatinine, Ser: 0.91 mg/dL (ref 0.61–1.24)
GFR, Estimated: >60 mL/min (ref >60)
Glucose, Bld: 118 mg/dL — ABNORMAL HIGH (ref 70–99)
Potassium: 3.4 mmol/L — ABNORMAL LOW (ref 3.5–5.1)
Sodium: 141 mmol/L (ref 135–145)

## 2021-09-18 LAB — CBC
HCT: 38.2 % — ABNORMAL LOW (ref 39.0–52.0)
Hemoglobin: 12.4 g/dL — ABNORMAL LOW (ref 13.0–17.0)
MCH: 28.6 pg (ref 26.0–34.0)
MCHC: 32.5 g/dL (ref 30.0–36.0)
MCV: 88 fL (ref 80.0–100.0)
Platelets: 283 10*3/uL (ref 150–400)
RBC: 4.34 MIL/uL (ref 4.22–5.81)
RDW: 14.6 % (ref 11.5–15.5)
WBC: 9.2 10*3/uL (ref 4.0–10.5)
nRBC: 0 % (ref 0.0–0.2)

## 2021-09-18 NOTE — Progress Notes (Signed)
?  Transition of Care (TOC) Screening Note ? ? ?Patient Details  ?Name: [name] ?Date of Birth: May 17, 1945 ? ? ?Transition of Care (TOC) CM/SW Contact:    ?Taiyana Kissler, LCSW ?Phone Number: ?09/18/2021, 9:20 AM ? ? ? ?Transition of Care Department [facility]) has reviewed patient and no TOC needs have been identified at this time. We will continue to monitor patient advancement through interdisciplinary progression rounds. If new patient transition needs arise, please place a TOC consult. ? ? ?

## 2021-09-18 NOTE — Plan of Care (Signed)

## 2021-09-18 NOTE — Progress Notes (Signed)
2 Days Post-Op  ? ?Subjective/Chief Complaint: ?Complains of soreness. Tolerating soft diet. No significant bm or flatus yet ? ? ?Objective: ?Vital signs in last 24 hours: ?Temp:  [98.2 ?F (36.8 ?C)-98.8 ?F (37.1 ?C)] 98.5 ?F (36.9 ?C) (05/14 0511) ?Pulse Rate:  [56-66] 66 (05/14 0511) ?Resp:  [18-20] 18 (05/14 0511) ?BP: (104-122)/(59-66) 122/63 (05/14 0511) ?SpO2:  [94 %-97 %] 95 % (05/14 0511) ?Weight:  [102.7 kg] 102.7 kg (05/14 0500) ?Last BM Date : 09/17/21 ? ?Intake/Output from previous day: ?05/13 0701 - 05/14 0700 ?In: 2699.6 [P.O.:1300; I.V.:1399.6] ?Out: 201 [Urine:200; Stool:1] ?Intake/Output this shift: ?No intake/output data recorded. ? ?General appearance: alert and cooperative ?Resp: clear to auscultation bilaterally ?Cardio: regular rate and rhythm ?GI: soft, mild tenderness. Incision ok ? ?Lab Results:  ?Recent Labs  ?  09/17/21 ?0355 09/18/21 ?9741  ?WBC 8.0 9.2  ?HGB 10.8* 12.4*  ?HCT 33.6* 38.2*  ?PLT 249 283  ? ?BMET ?Recent Labs  ?  09/17/21 ?0454 09/18/21 ?0412  ?NA 136 141  ?K 3.2* 3.4*  ?CL 104 106  ?CO2 27 29  ?GLUCOSE 116* 118*  ?BUN 8 10  ?CREATININE 0.89 0.91  ?CALCIUM 8.7* 9.3  ? ?PT/INR ?No results for input(s): LABPROT, INR in the last 72 hours. ?ABG ?No results for input(s): PHART, HCO3 in the last 72 hours. ? ?Invalid input(s): PCO2, PO2 ? ?Studies/Results: ?No results found. ? ?Anti-infectives: ?Anti-infectives (From admission, onward)  ? ? Start     Dose/Rate Route Frequency Ordered Stop  ? 09/16/21 1400  neomycin (MYCIFRADIN) tablet 1,000 mg  Status:  Discontinued       ?See Hyperspace for full Linked Orders Report.  ? 1,000 mg Oral 3 times per day 09/16/21 0533 09/16/21 0539  ? 09/16/21 1400  metroNIDAZOLE (FLAGYL) tablet 1,000 mg  Status:  Discontinued       ?See Hyperspace for full Linked Orders Report.  ? 1,000 mg Oral 3 times per day 09/16/21 0533 09/16/21 0539  ? 09/16/21 0600  cefoTEtan (CEFOTAN) 2 g in sodium chloride 0.9 % 100 mL IVPB       ? 2 g ?200 mL/hr over 30  Minutes Intravenous On call to O.R. 09/16/21 0533 09/16/21 0751  ? ?  ? ? ?Assessment/Plan: ?s/p Procedure(s): ?LAPAROSCOPIC EXTENDED RIGHT HEMI COLECTOMY (Right) ?EXCISION MASS LEFT CHEST WALL (Left) ?Advance diet ?Ambulate ?POD 2 ?Lovenox for vte prophylaxis ? LOS: 2 days  ? ? ?[name] ?09/18/2021 ? ?

## 2021-09-19 ENCOUNTER — Other Ambulatory Visit: Payer: Self-pay

## 2021-09-19 LAB — BASIC METABOLIC PANEL
Anion gap: 6 (ref 5–15)
BUN: 9 mg/dL (ref 8–23)
CO2: 28 mmol/L (ref 22–32)
Calcium: 9.1 mg/dL (ref 8.9–10.3)
Chloride: 104 mmol/L (ref 98–111)
Creatinine, Ser: 0.91 mg/dL (ref 0.61–1.24)
GFR, Estimated: >60 mL/min (ref >60)
Glucose, Bld: 129 mg/dL — ABNORMAL HIGH (ref 70–99)
Potassium: 3.3 mmol/L — ABNORMAL LOW (ref 3.5–5.1)
Sodium: 138 mmol/L (ref 135–145)

## 2021-09-19 LAB — CBC
HCT: 35.1 % — ABNORMAL LOW (ref 39.0–52.0)
Hemoglobin: 11.3 g/dL — ABNORMAL LOW (ref 13.0–17.0)
MCH: 28.5 pg (ref 26.0–34.0)
MCHC: 32.2 g/dL (ref 30.0–36.0)
MCV: 88.6 fL (ref 80.0–100.0)
Platelets: 269 10*3/uL (ref 150–400)
RBC: 3.96 MIL/uL — ABNORMAL LOW (ref 4.22–5.81)
RDW: 14.7 % (ref 11.5–15.5)
WBC: 8.2 10*3/uL (ref 4.0–10.5)
nRBC: 0 % (ref 0.0–0.2)

## 2021-09-19 NOTE — Progress Notes (Signed)
3 Days Post-Op  ? ?Subjective/Chief Complaint: ?No complaints at present. Tolerating soft diet. Passing flatus, having multiple Bms. No nausea nor emesis. Denies bloating. Pain well controlled. Ambulating well on his own ? ? ?Objective: ?Vital signs in last 24 hours: ?Temp:  [97.9 ?F (36.6 ?C)-98.7 ?F (37.1 ?C)] 98.7 ?F (37.1 ?C) (05/15 0500) ?Pulse Rate:  [55-59] 55 (05/15 0500) ?Resp:  [16-18] 16 (05/15 0500) ?BP: (111-145)/(70-78) 112/70 (05/15 0500) ?SpO2:  [94 %-99 %] 94 % (05/15 0500) ?Weight:  [100.7 kg] 100.7 kg (05/15 0500) ?Last BM Date : 09/18/21 ? ?Intake/Output from previous day: ?05/14 0701 - 05/15 0700 ?In: 1223.5 [P.O.:480; I.V.:743.5] ?Out: -  ?Intake/Output this shift: ?Total I/O ?In: 480 [P.O.:480] ?Out: -  ? ?General appearance: alert and cooperative ?Resp: clear to auscultation bilaterally ?Cardio: regular rate and rhythm ?GI: soft, non tender, nondistended. Incisions c/d/I without erythema or drainage. ? ?Lab Results:  ?Recent Labs  ?  09/18/21 ?0412 09/19/21 ?0410  ?WBC 9.2 8.2  ?HGB 12.4* 11.3*  ?HCT 38.2* 35.1*  ?PLT 283 269  ? ?BMET ?Recent Labs  ?  09/18/21 ?0412 09/19/21 ?0410  ?NA 141 138  ?K 3.4* 3.3*  ?CL 106 104  ?CO2 29 28  ?GLUCOSE 118* 129*  ?BUN 10 9  ?CREATININE 0.91 0.91  ?CALCIUM 9.3 9.1  ? ?PT/INR ?No results for input(s): LABPROT, INR in the last 72 hours. ?ABG ?No results for input(s): PHART, HCO3 in the last 72 hours. ? ?Invalid input(s): PCO2, PO2 ? ?Studies/Results: ?No results found. ? ?Anti-infectives: ?Anti-infectives (From admission, onward)  ? ? Start     Dose/Rate Route Frequency Ordered Stop  ? 09/16/21 1400  neomycin (MYCIFRADIN) tablet 1,000 mg  Status:  Discontinued       ?See Hyperspace for full Linked Orders Report.  ? 1,000 mg Oral 3 times per day 09/16/21 0533 09/16/21 0539  ? 09/16/21 1400  metroNIDAZOLE (FLAGYL) tablet 1,000 mg  Status:  Discontinued       ?See Hyperspace for full Linked Orders Report.  ? 1,000 mg Oral 3 times per day 09/16/21 0533  09/16/21 0539  ? 09/16/21 0600  cefoTEtan (CEFOTAN) 2 g in sodium chloride 0.9 % 100 mL IVPB       ? 2 g ?200 mL/hr over 30 Minutes Intravenous On call to O.R. 09/16/21 0533 09/16/21 0751  ? ?  ? ? ?Assessment/Plan: ?s/p Procedure(s): ?LAPAROSCOPIC EXTENDED RIGHT HEMI COLECTOMY (Right) ?EXCISION MASS LEFT CHEST WALL (Left) ? ?-Doing great ?-Reviewed his procedure, findings and long-term plans, answered all questions ?-Discussed expectations in postop period ?-He is comfortable with and stable for discharge home ?-Follow-up in our office arranged ? ? LOS: 3 days  ? ? ?[name] ?09/19/2021 ? ?

## 2021-09-19 NOTE — Care Management Important Message (Signed)
Important Message ? ?Patient Details IM Letter mailed due to Discharge ?Name: [name] ?MRN: [id number] ?Date of Birth: [birth year] ? ? ?Medicare Important Message Given:  Yes - Important Message mailed due to current National Emergency ? ? ? ? ?Kerin Salen ?09/19/2021, 2:50 PM ?

## 2021-09-19 NOTE — Progress Notes (Signed)
Nurse reviewed discharge instructions with pt.  Pt verbalized understanding of discharge instructions, follow up appointments and new medication.  Pt discharging to home and daughter picking him up from hospital.   ?

## 2021-09-19 NOTE — Discharge Summary (Signed)
Patient ID: ?Tawana Scale ?MRN: [id number] ?DOB/AGE: July 02, 1945 76 y.o. ? ?Admit date: 09/16/2021 ?Discharge date: 09/19/2021 ? ?Discharge Diagnoses ?Patient Active Problem List  ? Diagnosis Date Noted  ? S/P right hemicolectomy 09/16/2021  ? Colon cancer ([place]) 08/15/2021  ? OAG (open angle glaucoma) suspect, high risk, right 05/10/2018  ? Primary open angle glaucoma (POAG) of left eye, moderate stage 05/10/2018  ? 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 History of radial keratotomy 05/27/2014  ? Other retinal detachments 05/27/2014  ? PCO (posterior capsular opacification), right 05/27/2014  ? Pseudophakia of both eyes 05/27/2014  ? ? ?Consultants ?None ? ?Procedures ?OR 09/16/21 -  ?Extended laparoscopic right hemicolectomy with ileo-transverse anastomosis ?Excision of 1.5 x 1.5 cm subcutaneous mass from left chest wall ? ?Hospital Course: He was admitted postoperatively and recovered well.  He had no issues.  His diet was gradually advanced.  On 09/18/2021, he began having spontaneous bowel function with flatus and bowel movements.  On 09/19/2021, he continued to tolerate a regular diet and have reliable bowel function.  He was comfortable with and stable for discharge home. ? ? ? ?Allergies as of 09/19/2021   ? ?   Reactions  ? Ibuprofen Other (See Comments)  ? Blurred vision  ? ?  ? ?  ?Medication List  ?  ? ?TAKE these medications   ? ?aspirin 81 MG tablet ?Take 81 mg by mouth every other day. ?  ?cholecalciferol 25 MCG (1000 UNIT) tablet ?Commonly known as: VITAMIN D3 ?Take 1,000 Units by mouth daily. ?  ?Chromium Picolinate 800 MCG Tabs ?Take 800 mcg by mouth daily. ?  ?CINNAMON PO ?Take 1,500 mg by mouth daily. ?  ?diazepam 5 MG tablet ?Commonly known as: VALIUM ?Take 5 mg by mouth 2 (two) times daily as needed for anxiety. ?  ?dorzolamide-timolol 22.3-6.8 MG/ML ophthalmic solution ?Commonly known as: COSOPT ?Place 1 drop into both eyes 2 (two) times daily. ?  ?fenofibrate 160 MG tablet ?Take 160  mg by mouth daily. ?  ?Fish Oil 1200 MG Caps ?Take 1,200 mg by mouth in the morning and at bedtime. ?  ?hydrochlorothiazide 25 MG tablet ?Commonly known as: HYDRODIURIL ?Take 25 mg by mouth daily. ?  ?pantoprazole 40 MG tablet ?Commonly known as: PROTONIX ?Take 40 mg by mouth 2 (two) times daily. ?  ?Rybelsus 7 MG Tabs ?Generic drug: Semaglutide ?Take 7 mg by mouth daily. ?  ?simvastatin 20 MG tablet ?Commonly known as: ZOCOR ?Take 20 mg by mouth every evening. ?  ?SUPER B COMPLEX/FA/VIT C PO ?Take 1 capsule by mouth daily. With Folic acid ?  ?traMADol 50 MG tablet ?Commonly known as: Ultram ?Take 1 tablet (50 mg total) by mouth every 6 (six) hours as needed for up to 5 days (postop pain not controlled with tylenol and ibuprofen first). ?  ?Vitamin A 2400 MCG (8000 UT) Caps ?Take 8,000 Units by mouth daily. ?  ?vitamin C 500 MG tablet ?Commonly known as: ASCORBIC ACID ?Take 500 mg by mouth daily. ?  ?zinc gluconate 50 MG tablet ?Take 50 mg by mouth daily. ?  ? ?  ? ? ? ? Follow-up Information   ? ? Ileana Roup, MD Follow up in 2 week(s).   ?Specialties: General Surgery, Colon and Rectal Surgery ?Contact information: ?[address] ?[address] ?[place] [address] ?[phone] ? ? ?  ?  ? ?  ?  ? ?  ? ? ?Sharon Mt. Dema Severin, M.D. ?[facility] ? ?

## 2021-09-20 LAB — SURGICAL PATHOLOGY

## 2021-09-23 ENCOUNTER — Encounter: Payer: Self-pay | Admitting: *Deleted

## 2021-09-23 NOTE — Progress Notes (Signed)
PATIENT NAVIGATOR PROGRESS NOTE  Name: ISIAC BREIGHNER Date: 09/23/2021 MRN: [id number]  DOB: [birth year]   Reason for visit:  F/U call after surgery  Comments:  Spoke with Mr Hennes after surgery and he is doing well, soreness after walking yesterday, eating and drinking and moving bowels more regularly.  He will have F/U with Dr Benay Spice on Tuesday, May 23    Time spent counseling/coordinating care: 45-60 minutes

## 2021-09-27 ENCOUNTER — Inpatient Hospital Stay: Payer: Medicare Other | Attending: Oncology | Admitting: Oncology

## 2021-09-27 ENCOUNTER — Other Ambulatory Visit (HOSPITAL_COMMUNITY): Payer: Self-pay

## 2021-09-27 ENCOUNTER — Telehealth: Payer: Self-pay | Admitting: Pharmacy Technician

## 2021-09-27 VITALS — BP 126/73 | HR 67 | Temp 98.2°F | Resp 18 | Wt 216.6 lb

## 2021-09-27 DIAGNOSIS — E785 Hyperlipidemia, unspecified: Secondary | ICD-10-CM | POA: Diagnosis not present

## 2021-09-27 DIAGNOSIS — I10 Essential (primary) hypertension: Secondary | ICD-10-CM | POA: Insufficient documentation

## 2021-09-27 DIAGNOSIS — C184 Malignant neoplasm of transverse colon: Secondary | ICD-10-CM | POA: Insufficient documentation

## 2021-09-27 MED ORDER — CAPECITABINE 500 MG PO TABS
ORAL_TABLET | ORAL | 0 refills | Status: DC
  Filled 2021-09-27: qty 98, fill #0

## 2021-09-27 NOTE — Telephone Encounter (Signed)
Oral Oncology Patient Advocate Encounter  After completing a benefits investigation, prior authorization for Capecitabine is not required at this time through [employer].  Patient's copay is $33.68. (Medicare B benefit).  [facility] Patient [place] Phone [phone] Fax [phone] 09/27/2021 3:59 PM

## 2021-09-27 NOTE — Progress Notes (Signed)
[name]   Diagnosis: Colon cancer  INTERVAL HISTORY:   [name] was taken to a laparoscopic right hemicolectomy with ileotransverse anastomosis by Dr. Dema Severin on 09/16/2021.  Tattoo was noted in the mid transverse colon.  No evidence of metastatic disease.  A left chest wall subcutaneous mass was excised. The pathology revealed a hemangioma involving the chest wall mass.  The right colon resection revealed a moderately differentiated adenocarcinoma of the transverse colon.  Tumor invades into pericolonic connective tissue.  Lymphovascular invasion is present.  No perineural invasion.  Resection margins are negative.  1 of 19 lymph nodes contained metastatic carcinoma.  The tumor returned microsatellite stable with no loss of mismatch repair protein expression.  [name] is recovering from surgery.  He reports frequent loose bowel movements.  He has soreness in the abdomen.  Objective:  Vital signs in last 24 hours:  Blood pressure 126/73, pulse 67, temperature 98.2 F (36.8 C), temperature source Oral, resp. rate 18, weight 216 lb 9.6 oz (98.2 kg), SpO2 99 %.     Resp: Lungs clear bilaterally Cardio: Regular rate and rhythm GI: Healed surgical incisions, no hepatosplenomegaly Vascular: No leg edema     Lab Results:  Lab Results  Component Value Date   WBC 8.2 09/19/2021   HGB 11.3 (L) 09/19/2021   HCT 35.1 (L) 09/19/2021   MCV 88.6 09/19/2021   PLT 269 09/19/2021   NEUTROABS 5.3 09/07/2021    CMP  Lab Results  Component Value Date   NA 138 09/19/2021   K 3.3 (L) 09/19/2021   CL 104 09/19/2021   CO2 28 09/19/2021   GLUCOSE 129 (H) 09/19/2021   BUN 9 09/19/2021   CREATININE 0.91 09/19/2021   CALCIUM 9.1 09/19/2021   PROT 7.1 09/07/2021   ALBUMIN 4.0 09/07/2021   AST 22 09/07/2021   ALT 22 09/07/2021   ALKPHOS 50 09/07/2021   BILITOT 0.4 09/07/2021   GFRNONAA >60 09/19/2021   GFRAA  12/11/2007    >60        The eGFR has  been calculated using the MDRD equation. This calculation has not been validated in all clinical    Lab Results  Component Value Date   CEA 0.9 05/21/2009     Medications: I have reviewed the patient's current medications.   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  He is recovering from an extended right hemicolectomy.  We discussed details of the surgical pathology report, the prognosis, and adjuvant systemic treatment options.  I explained the expected decrease in recurrence rate with adjuvant 5-fluorouracil based chemotherapy.  We also discussed the expected benefit with the addition of oxaliplatin.  We reviewed potential toxicities associated with single agent 5-fluorouracil/capecitabine, and oxaliplatin.  We discussed the recommendation for Port-A-Cath placement if he were to receive CAPOX or FOLFOX.  [name] does not wish to receive oxaliplatin.  He agrees to proceed with single agent capecitabine.  We reviewed potential toxicities associated with capecitabine including the chance of nausea, mucositis, diarrhea, and hematologic  toxicity.  We discussed the rash, hyperpigmentation, sun sensitivity, and hand/foot syndrome associated with capecitabine.  [name] does not appear to have hereditary nonpolyposis colon cancer syndrome, but his family members are increased risk of developing colorectal cancer and should receive appropriate screening.  He will return for an office visit 10/13/2021 or 10/14/2021.  The plan is to begin adjuvant capecitabine on 10/17/2021.  A prescription for capecitabine was entered today. Betsy Coder, MD  09/27/2021  11:28 AM

## 2021-09-28 ENCOUNTER — Other Ambulatory Visit: Payer: Self-pay

## 2021-09-28 ENCOUNTER — Other Ambulatory Visit (HOSPITAL_COMMUNITY): Payer: Self-pay

## 2021-09-28 ENCOUNTER — Telehealth: Payer: Self-pay | Admitting: Pharmacist

## 2021-09-28 DIAGNOSIS — C184 Malignant neoplasm of transverse colon: Secondary | ICD-10-CM

## 2021-09-28 MED ORDER — CAPECITABINE 500 MG PO TABS
ORAL_TABLET | ORAL | 0 refills | Status: DC
  Filled 2021-09-28: qty 98, fill #0

## 2021-09-28 NOTE — Progress Notes (Signed)
The proposed treatment discussed in conference is for discussion purpose only and is not a binding recommendation.  The patients have not been physically examined, or presented with their treatment options.  Therefore, final treatment plans cannot be decided.  

## 2021-09-28 NOTE — Telephone Encounter (Signed)
Oral Oncology Pharmacist Encounter  Received new prescription for Xeloda (capecitabine) for the adjuvant treatment of stage IIIa colon cancer, planned duration ~6 months. Planned start 10/17/21.  BMP from 09/19/21 assessed, no relevant lab abnormalities. Prescription dose and frequency assessed.   Current medication list in Epic reviewed, a few DDIs with capecitabine identified: Pantoprazole: Proton Pump Inhibitors (PPI) may diminish the therapeutic effect of capecitabine, varying information on the clinical impact. Recommend evaluating the need for a PPI/acid suppression. If acid suppression is needed, attempt switching to a H2 antagonist (eg, famotidine) if possible. Recommend patient hold his folic acid containing vit B/C medication while on the capecitabine.   Evaluated chart and no patient barriers to medication adherence identified.   Prescription has been e-scribed to the [facility] for benefits analysis and approval.  Oral Oncology Clinic will continue to follow for insurance authorization, copayment issues, initial counseling and start date.  Patient agreed to treatment on 09/27/21 per MD documentation.  Darl Pikes, PharmD, BCPS, BCOP, CPP Hematology/Oncology Clinical Pharmacist Practitioner [place]/DB/AP [facility] [phone]  09/28/2021 12:11 PM

## 2021-09-30 DIAGNOSIS — R61 Generalized hyperhidrosis: Secondary | ICD-10-CM | POA: Diagnosis not present

## 2021-10-05 NOTE — Telephone Encounter (Addendum)
Patient will call after appointment on 10/19/21 if he decides he wants to start treatment.  [facility] [facility] Patient [place] Phone [phone] Fax [phone] 10/10/2021 4:14 PM

## 2021-10-06 NOTE — Telephone Encounter (Signed)
Oral Chemotherapy Pharmacist Encounter  Patient reports not feeling great currently and he wants to wait until after his office appt on 10/19/21 to decide on having his capecitabine delivered/starting therapy. He has the phone number to call Bethena [facility] to set-up delivery following his office appt.  Patient Education I spoke with patient for overview of new oral chemotherapy medication: Xeloda (capecitabine) for the adjuvant treatment of stage IIIa colon cancer, planned duration ~6 months. Planned start 10/17/21.   Pt is doing well. Counseled patient on administration, dosing, side effects, monitoring, drug-food interactions, safe handling, storage, and disposal. Patient will take 4 tablets (2000 mg) by mouth in AM and 3 tablets (1500 mg) in PM. Take with food. Take for 14 days, then hold for 7 days. Repeat every 21 days.  Side effects include but not limited to: diarrhea, hand-foot syndrome, mouth sores, edema, decreased wbc, fatigue, N/V Diarrhea: Patient is currently having diarrhea, suggested he start using the loperamide now for diarrhea management Hand-foot syndrome: Patient reported having Udderly Smooth Extra Care 20 on hand Mouth sores: He knows to call for magic mouthwash if mouth sores occur.    Reviewed with patient importance of keeping a medication schedule and plan for any missed doses.  After discussion with patient no patient barriers to medication adherence identified.   Mr. Chagnon voiced understanding and appreciation. All questions answered. Medication handout provided.  Provided patient with Oral [facility] phone number. Patient knows to call the office with questions or concerns. Oral Chemotherapy Navigation Clinic will continue to follow.  Darl Pikes, PharmD, BCPS, BCOP, CPP Hematology/Oncology Clinical Pharmacist Practitioner [place]/DB/AP [facility] [phone]  10/06/2021 3:51 PM

## 2021-10-13 ENCOUNTER — Ambulatory Visit: Payer: Medicare Other | Admitting: Nurse Practitioner

## 2021-10-13 ENCOUNTER — Other Ambulatory Visit: Payer: Medicare Other

## 2021-10-19 ENCOUNTER — Inpatient Hospital Stay: Payer: Medicare Other | Attending: Oncology

## 2021-10-19 ENCOUNTER — Inpatient Hospital Stay: Payer: Medicare Other

## 2021-10-19 ENCOUNTER — Inpatient Hospital Stay: Payer: Medicare Other | Admitting: Nurse Practitioner

## 2021-10-19 ENCOUNTER — Other Ambulatory Visit: Payer: Self-pay

## 2021-10-19 ENCOUNTER — Encounter: Payer: Self-pay | Admitting: Nurse Practitioner

## 2021-10-19 ENCOUNTER — Telehealth: Payer: Self-pay

## 2021-10-19 VITALS — BP 125/67 | HR 78 | Temp 98.1°F | Resp 18 | Wt 211.8 lb

## 2021-10-19 DIAGNOSIS — I10 Essential (primary) hypertension: Secondary | ICD-10-CM | POA: Insufficient documentation

## 2021-10-19 DIAGNOSIS — C184 Malignant neoplasm of transverse colon: Secondary | ICD-10-CM

## 2021-10-19 DIAGNOSIS — A0472 Enterocolitis due to Clostridium difficile, not specified as recurrent: Secondary | ICD-10-CM | POA: Diagnosis not present

## 2021-10-19 DIAGNOSIS — E785 Hyperlipidemia, unspecified: Secondary | ICD-10-CM | POA: Diagnosis not present

## 2021-10-19 LAB — CMP (CANCER CENTER ONLY)
ALT: 18 U/L (ref 0–44)
AST: 14 U/L — ABNORMAL LOW (ref 15–41)
Albumin: 3.4 g/dL — ABNORMAL LOW (ref 3.5–5.0)
Alkaline Phosphatase: 54 U/L (ref 38–126)
Anion gap: 10 (ref 5–15)
BUN: 12 mg/dL (ref 8–23)
CO2: 28 mmol/L (ref 22–32)
Calcium: 9.7 mg/dL (ref 8.9–10.3)
Chloride: 101 mmol/L (ref 98–111)
Creatinine: 0.93 mg/dL (ref 0.61–1.24)
GFR, Estimated: >60 mL/min (ref >60)
Glucose, Bld: 133 mg/dL — ABNORMAL HIGH (ref 70–99)
Potassium: 4.2 mmol/L (ref 3.5–5.1)
Sodium: 139 mmol/L (ref 135–145)
Total Bilirubin: 0.5 mg/dL (ref 0.3–1.2)
Total Protein: 5.8 g/dL — ABNORMAL LOW (ref 6.5–8.1)

## 2021-10-19 LAB — CBC WITH DIFFERENTIAL (CANCER CENTER ONLY)
Abs Immature Granulocytes: 0.13 10*3/uL — ABNORMAL HIGH (ref 0.00–0.07)
Basophils Absolute: 0.1 10*3/uL (ref 0.0–0.1)
Basophils Relative: 1 %
Eosinophils Absolute: 0.2 10*3/uL (ref 0.0–0.5)
Eosinophils Relative: 2 %
HCT: 40.5 % (ref 39.0–52.0)
Hemoglobin: 13.3 g/dL (ref 13.0–17.0)
Immature Granulocytes: 1 %
Lymphocytes Relative: 16 %
Lymphs Abs: 1.6 10*3/uL (ref 0.7–4.0)
MCH: 26.9 pg (ref 26.0–34.0)
MCHC: 32.8 g/dL (ref 30.0–36.0)
MCV: 82 fL (ref 80.0–100.0)
Monocytes Absolute: 1.1 10*3/uL — ABNORMAL HIGH (ref 0.1–1.0)
Monocytes Relative: 11 %
Neutro Abs: 6.8 10*3/uL (ref 1.7–7.7)
Neutrophils Relative %: 69 %
Platelet Count: 315 10*3/uL (ref 150–400)
RBC: 4.94 MIL/uL (ref 4.22–5.81)
RDW: 15.1 % (ref 11.5–15.5)
WBC Count: 9.9 10*3/uL (ref 4.0–10.5)
nRBC: 0 % (ref 0.0–0.2)

## 2021-10-19 LAB — CEA (ACCESS): CEA (CHCC): 2.66 ng/mL (ref 0.00–5.00)

## 2021-10-19 LAB — C DIFFICILE QUICK SCREEN W PCR REFLEX
C Diff antigen: POSITIVE — AB
C Diff interpretation: DETECTED
C Diff toxin: POSITIVE — AB

## 2021-10-19 MED ORDER — SODIUM CHLORIDE 0.9 % IV SOLN: INTRAVENOUS | Status: DC

## 2021-10-19 NOTE — Progress Notes (Signed)
  [facility] OFFICE PROGRESS NOTE   Diagnosis: Colon cancer  INTERVAL HISTORY:   [name] returns as scheduled.  He has not decided whether or not he wants to proceed with adjuvant Xeloda.  He does not feel well.  He reports a 2-week history of up to 10 watery bowel movements a day.  He has not seen any blood.  No fever.  He has occasional abdominal pain before bowel movement.  He began Metamucil and Imodium per Dr. Orest Dikes recommendation.  He is afraid to eat/drink due to the frequent bowel movements.  Intermittent lightheadedness/dizziness.  Objective:  Vital signs in last 24 hours:  Blood pressure 125/67, pulse 78, temperature 98.1 F (36.7 C), temperature source Tympanic, resp. rate 18, weight 211 lb 12.8 oz (96.1 kg), SpO2 98 %.    HEENT: Mucous membranes appear moist. Resp: Lungs clear bilaterally. Cardio: Regular rate and rhythm. GI: Abdomen soft and nontender.  Bowel sounds active.  Healed midline surgical incision. Vascular: No leg edema. Skin: Skin turgor is intact.   Lab Results:  Lab Results  Component Value Date   WBC 9.9 10/19/2021   HGB 13.3 10/19/2021   HCT 40.5 10/19/2021   MCV 82.0 10/19/2021   PLT 315 10/19/2021   NEUTROABS 6.8 10/19/2021    Imaging:  No results found.  Medications: I have reviewed the patient's current medications.  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/16/2021 Transverse colon mass noted on colonoscopy 3/29/202-invasive moderately differentiated adenocarcinoma, no loss of mismatch repair protein expression CTs 08/08/2021-left low axillary chest wall nodule measuring 1.1 cm, postoperative findings in the left colon, complex ventral hernia Transverse colon cancer 09/16/2021-MSS, no loss of mismatch repair protein expression, lymphovascular invasion present, no tumor deposits Stage I (T1N0) well-differentiated adenocarcinoma of the splenic flexure with  neuroendocrine features 2006, status post a splenic flexure resection Hypertension Hyperlipidemia Multiple polyps on the colonoscopy 08/03/2021 including a transverse colon polyp with focal high-grade dysplasia  Disposition: [name] has stage III colon cancer.  He is undecided regarding adjuvant chemotherapy.  At present he is having significant issues with diarrhea.  This is likely related to recent surgery plus previous splenic flexure resection.  Per Dr. Orest Dikes recommendation he is taking Metamucil and Imodium.  Thus far he has noted no improvement.  We are giving him IV fluids today.  Also checking stool for C. difficile.  He will contact Dr. Orest Dikes office if he has had no improvement by the end of the week.  He was encouraged to push fluids orally.  He will return in 2 weeks for more discussion regarding adjuvant Xeloda.    Ned Card ANP/GNP-BC   10/19/2021  3:52 PM

## 2021-10-19 NOTE — Telephone Encounter (Addendum)
Patient called in and stated he's having diarrhea. The diarrhea stated about 14 days ago and he spoke with Dr whites about the issue.  Dr Whites recommend imodium and he's taking the imodium about 4x a day. He feel dehydrate and fatigue. Patient denied any pain, fever, shortness of breath. He did have chill about to two weeks ago. I advice the patient to come in today for his schedule appointment.  The patient stated he will be in.

## 2021-10-20 ENCOUNTER — Telehealth: Payer: Self-pay

## 2021-10-20 ENCOUNTER — Other Ambulatory Visit: Payer: Self-pay | Admitting: Nurse Practitioner

## 2021-10-20 DIAGNOSIS — C184 Malignant neoplasm of transverse colon: Secondary | ICD-10-CM

## 2021-10-20 MED ORDER — VANCOMYCIN HCL 125 MG PO CAPS: 125.0000 mg | ORAL_CAPSULE | Freq: Four times a day (QID) | ORAL | 0 refills | Status: AC

## 2021-10-20 NOTE — Addendum Note (Signed)
Addended by: Darl Pikes on: 10/20/2021 02:38 PM   Modules accepted: Orders

## 2021-10-20 NOTE — Telephone Encounter (Signed)
-----   Message from Owens Shark, NP sent at 10/20/2021 11:39 AM EDT ----- Please let him know the stool specimen returned positive for C. difficile (the infection we talked about yesterday).  I am sending a prescription to his pharmacy for vancomycin.  He should start this as soon as possible.

## 2021-10-20 NOTE — Telephone Encounter (Signed)
Patient gave verbal understanding and had no further questions or concerns  

## 2021-10-24 NOTE — Telephone Encounter (Signed)
Patient is still undecided on taking Xeloda.  He has a follow up with Ned Card on 6/27 to discuss further.

## 2021-10-28 ENCOUNTER — Ambulatory Visit: Payer: Medicare Other | Admitting: Physician Assistant

## 2021-10-28 ENCOUNTER — Ambulatory Visit (INDEPENDENT_AMBULATORY_CARE_PROVIDER_SITE_OTHER): Payer: Medicare Other

## 2021-10-28 ENCOUNTER — Encounter: Payer: Self-pay | Admitting: Physician Assistant

## 2021-10-28 DIAGNOSIS — M25572 Pain in left ankle and joints of left foot: Secondary | ICD-10-CM

## 2021-11-01 ENCOUNTER — Inpatient Hospital Stay: Payer: Medicare Other | Admitting: Nurse Practitioner

## 2021-11-01 ENCOUNTER — Telehealth: Payer: Self-pay | Admitting: Pharmacist

## 2021-11-01 ENCOUNTER — Other Ambulatory Visit (HOSPITAL_COMMUNITY): Payer: Self-pay

## 2021-11-01 ENCOUNTER — Encounter: Payer: Self-pay | Admitting: Nurse Practitioner

## 2021-11-01 VITALS — BP 150/74 | HR 70 | Temp 98.1°F | Resp 18 | Ht 66.0 in | Wt 216.0 lb

## 2021-11-01 DIAGNOSIS — C184 Malignant neoplasm of transverse colon: Secondary | ICD-10-CM

## 2021-11-01 DIAGNOSIS — E785 Hyperlipidemia, unspecified: Secondary | ICD-10-CM | POA: Diagnosis not present

## 2021-11-01 DIAGNOSIS — I10 Essential (primary) hypertension: Secondary | ICD-10-CM | POA: Diagnosis not present

## 2021-11-01 MED ORDER — CAPECITABINE 500 MG PO TABS
ORAL_TABLET | ORAL | 0 refills | Status: DC
  Filled 2021-11-01: qty 98, fill #0
  Filled 2021-11-02: qty 98, 14d supply, fill #0

## 2021-11-01 NOTE — Telephone Encounter (Signed)
Patient has decided to proceed with capecitabine.

## 2021-11-02 ENCOUNTER — Other Ambulatory Visit (HOSPITAL_COMMUNITY): Payer: Self-pay

## 2021-11-02 NOTE — Telephone Encounter (Signed)
Oral Chemotherapy Pharmacist Encounter  Mr. Grigg has picked up his Xeloda and will get started on 11/07/21  Patient Education I spoke with patient for overview of new oral chemotherapy medication: Xeloda (capecitabine) for the adjuvant treatment of stage IIIa colon cancer, planned duration ~6 months. Planned start 11/07/21.  Pt is doing well. Counseled patient on administration, dosing, side effects, monitoring, drug-food interactions, safe handling, storage, and disposal. Patient will take 4 tablets (2000 mg) by mouth in AM and 3 tablets (1500 mg) in PM. Take with food. Take for 14 days, then hold for 7 days. Repeat every 21 days.   Side effects include but not limited to: diarrhea, hand-foot syndrome, mouth sores, edema, decreased wbc, fatigue, N/V Diarrhea: Patient's previous diarrhea is now controlled, he still has loperamide to use at home if needed. He knows to call he office if the loperamide is not working and he continues to have 4 or more loose stools Hand-foot syndrome: Patient plans on picking up some Udderly Smooth Extra Care 20 to use on his feet and hands Mouth sores: He knows to call for magic mouthwash if mouth sores occur.  Reviewed with patient importance of keeping a medication schedule and plan for any missed doses.  After discussion with patient no patient barriers to medication adherence identified.   Mr. Blatz voiced understanding and appreciation. All questions answered. Medication handout and calendar provided.  Provided patient with Oral [facility] phone number. Patient knows to call the office with questions or concerns. Oral Chemotherapy Navigation Clinic will continue to follow.  Darl Pikes, PharmD, BCPS, BCOP, CPP Hematology/Oncology Clinical Pharmacist Practitioner [place]/DB/AP Oral [facility] [phone]  11/02/2021 4:47 PM

## 2021-11-03 ENCOUNTER — Other Ambulatory Visit (HOSPITAL_COMMUNITY): Payer: Self-pay

## 2021-11-03 NOTE — Telephone Encounter (Signed)
Oral Oncology Patient Advocate Encounter  I spoke with Mr Jaggers on 11/02/21 to set up first fill of Capecitabine. Rx will be filled through [facility] Patient picked up on 11/02/21.  [facility] will call 7-10 days before next refill is due to complete adherence call and set up delivery of medication.     [place] [facility] Patient [place] Phone [phone] Fax [phone] 11/03/2021 11:53 AM

## 2021-11-18 ENCOUNTER — Other Ambulatory Visit (HOSPITAL_COMMUNITY): Payer: Self-pay

## 2021-11-18 ENCOUNTER — Other Ambulatory Visit: Payer: Self-pay | Admitting: Oncology

## 2021-11-18 DIAGNOSIS — C184 Malignant neoplasm of transverse colon: Secondary | ICD-10-CM

## 2021-11-21 ENCOUNTER — Other Ambulatory Visit (HOSPITAL_COMMUNITY): Payer: Self-pay

## 2021-11-22 ENCOUNTER — Other Ambulatory Visit (HOSPITAL_COMMUNITY): Payer: Self-pay

## 2021-11-22 ENCOUNTER — Other Ambulatory Visit: Payer: Self-pay | Admitting: Oncology

## 2021-11-22 DIAGNOSIS — C184 Malignant neoplasm of transverse colon: Secondary | ICD-10-CM

## 2021-11-23 ENCOUNTER — Other Ambulatory Visit (HOSPITAL_COMMUNITY): Payer: Self-pay

## 2021-11-24 ENCOUNTER — Other Ambulatory Visit (HOSPITAL_COMMUNITY): Payer: Self-pay

## 2021-11-24 ENCOUNTER — Inpatient Hospital Stay: Payer: Medicare Other | Attending: Oncology

## 2021-11-24 ENCOUNTER — Inpatient Hospital Stay: Payer: Medicare Other | Admitting: Oncology

## 2021-11-24 VITALS — BP 121/73 | HR 56 | Temp 97.9°F | Resp 16 | Wt 211.8 lb

## 2021-11-24 DIAGNOSIS — C184 Malignant neoplasm of transverse colon: Secondary | ICD-10-CM | POA: Diagnosis not present

## 2021-11-24 DIAGNOSIS — E785 Hyperlipidemia, unspecified: Secondary | ICD-10-CM | POA: Insufficient documentation

## 2021-11-24 DIAGNOSIS — R001 Bradycardia, unspecified: Secondary | ICD-10-CM | POA: Diagnosis not present

## 2021-11-24 DIAGNOSIS — I10 Essential (primary) hypertension: Secondary | ICD-10-CM | POA: Diagnosis not present

## 2021-11-24 LAB — CMP (CANCER CENTER ONLY)
ALT: 13 U/L (ref 0–44)
AST: 18 U/L (ref 15–41)
Albumin: 4.3 g/dL (ref 3.5–5.0)
Alkaline Phosphatase: 47 U/L (ref 38–126)
Anion gap: 8 (ref 5–15)
BUN: 16 mg/dL (ref 8–23)
CO2: 30 mmol/L (ref 22–32)
Calcium: 10.1 mg/dL (ref 8.9–10.3)
Chloride: 104 mmol/L (ref 98–111)
Creatinine: 1 mg/dL (ref 0.61–1.24)
GFR, Estimated: >60 mL/min (ref >60)
Glucose, Bld: 97 mg/dL (ref 70–99)
Potassium: 4.7 mmol/L (ref 3.5–5.1)
Sodium: 142 mmol/L (ref 135–145)
Total Bilirubin: 0.3 mg/dL (ref 0.3–1.2)
Total Protein: 6.5 g/dL (ref 6.5–8.1)

## 2021-11-24 LAB — CBC WITH DIFFERENTIAL (CANCER CENTER ONLY)
Abs Immature Granulocytes: 0.28 10*3/uL — ABNORMAL HIGH (ref 0.00–0.07)
Basophils Absolute: 0.1 10*3/uL (ref 0.0–0.1)
Basophils Relative: 1 %
Eosinophils Absolute: 0.3 10*3/uL (ref 0.0–0.5)
Eosinophils Relative: 3 %
HCT: 39.3 % (ref 39.0–52.0)
Hemoglobin: 12.8 g/dL — ABNORMAL LOW (ref 13.0–17.0)
Immature Granulocytes: 3 %
Lymphocytes Relative: 21 %
Lymphs Abs: 2.1 10*3/uL (ref 0.7–4.0)
MCH: 27.9 pg (ref 26.0–34.0)
MCHC: 32.6 g/dL (ref 30.0–36.0)
MCV: 85.6 fL (ref 80.0–100.0)
Monocytes Absolute: 0.8 10*3/uL (ref 0.1–1.0)
Monocytes Relative: 8 %
Neutro Abs: 6.5 10*3/uL (ref 1.7–7.7)
Neutrophils Relative %: 64 %
Platelet Count: 341 10*3/uL (ref 150–400)
RBC: 4.59 MIL/uL (ref 4.22–5.81)
RDW: 20 % — ABNORMAL HIGH (ref 11.5–15.5)
WBC Count: 9.9 10*3/uL (ref 4.0–10.5)
nRBC: 0 % (ref 0.0–0.2)

## 2021-11-24 MED ORDER — CAPECITABINE 500 MG PO TABS
ORAL_TABLET | ORAL | 0 refills | Status: DC
  Filled 2021-11-24: qty 98, 21d supply, fill #0

## 2021-11-24 NOTE — Progress Notes (Signed)
[facility] OFFICE PROGRESS NOTE   Diagnosis: Colon cancer  INTERVAL HISTORY:   [name] returns as scheduled.  He began cycle 1 Xeloda 11/07/2021.  No mouth sores, nausea, rash, or hand/foot pain.  He developed frequent bowel movements after 1 to 2 days of Xeloda.  The frequency was chiefly at night and occurred up to 5 times per day.  He did not take Imodium.  Stool frequency improved since the Xeloda was completed.  No other complaint.  The stool was formed.  Objective:  Vital signs in last 24 hours:  Blood pressure 125/67, pulse (!) 44, temperature 97.9 F (36.6 C), temperature source Skin, resp. rate 16, weight 211 lb 12.8 oz (96.1 kg), SpO2 100 %.    HEENT: No thrush or ulcers Resp: Lungs clear bilaterally Cardio: Irregular, bradycardia GI: No hepatosplenomegaly, reducible ventral hernia Vascular: No leg edema  Skin: Palms without erythema, dryness at the soles  Lab Results:  Lab Results  Component Value Date   WBC 9.9 11/24/2021   HGB 12.8 (L) 11/24/2021   HCT 39.3 11/24/2021   MCV 85.6 11/24/2021   PLT 341 11/24/2021   NEUTROABS 6.5 11/24/2021    CMP  Lab Results  Component Value Date   NA 142 11/24/2021   K 4.7 11/24/2021   CL 104 11/24/2021   CO2 30 11/24/2021   GLUCOSE 97 11/24/2021   BUN 16 11/24/2021   CREATININE 1.00 11/24/2021   CALCIUM 10.1 11/24/2021   PROT 6.5 11/24/2021   ALBUMIN 4.3 11/24/2021   AST 18 11/24/2021   ALT 13 11/24/2021   ALKPHOS 47 11/24/2021   BILITOT 0.3 11/24/2021   GFRNONAA >60 11/24/2021   GFRAA  12/11/2007    >60        The eGFR has been calculated using the MDRD equation. This calculation has not been validated in all clinical    Lab Results  Component Value Date   CEA 2.66 10/19/2021    Medications: I have reviewed the patient's current medications.   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 difficile, 10-day course of vancomycin completed    Disposition: [name] has completed 1 cycle of Xeloda.  He tolerated the Xeloda well.  He has bradycardia today.  We will check an EKG.  It is possible the bradycardia is related to toxicity from Xeloda.  He completed Xeloda 11/20/2021  He will complete cycle 2 Xeloda beginning 11/28/2021.  He will call for diarrhea.  Betsy Coder, MD  11/24/2021  3:23 PM Addendum: An EKG reveals a sinus bradycardia.  He also had sinus bradycardia on an EKG 09/07/2021 and he also had a low pulse rate on vital sign checks in the past.  I reviewed the current EKG and a previous EKG with cardiology.  He appears asymptomatic from the bradycardia.  The plan is to continue Xeloda.  He will call  for chest pain, dyspnea, or presyncope.

## 2021-11-25 ENCOUNTER — Inpatient Hospital Stay: Payer: Medicare Other

## 2021-11-25 ENCOUNTER — Inpatient Hospital Stay: Payer: Medicare Other | Admitting: Oncology

## 2021-12-07 ENCOUNTER — Other Ambulatory Visit (HOSPITAL_COMMUNITY): Payer: Self-pay

## 2021-12-13 ENCOUNTER — Telehealth: Payer: Self-pay | Admitting: Podiatry

## 2021-12-13 NOTE — Telephone Encounter (Signed)
Pt left message  @ 1225pm today wanting to schedule an appt.  I returned call and left message for pt to call us back to get scheduled.

## 2021-12-15 ENCOUNTER — Other Ambulatory Visit (HOSPITAL_COMMUNITY): Payer: Self-pay

## 2021-12-16 ENCOUNTER — Inpatient Hospital Stay: Payer: Medicare Other | Attending: Oncology

## 2021-12-16 ENCOUNTER — Other Ambulatory Visit (HOSPITAL_COMMUNITY): Payer: Self-pay

## 2021-12-16 ENCOUNTER — Other Ambulatory Visit: Payer: Self-pay

## 2021-12-16 ENCOUNTER — Inpatient Hospital Stay: Payer: Medicare Other | Admitting: Oncology

## 2021-12-16 VITALS — BP 130/66 | HR 55 | Temp 98.2°F | Resp 20 | Ht 66.0 in | Wt 216.2 lb

## 2021-12-16 DIAGNOSIS — I10 Essential (primary) hypertension: Secondary | ICD-10-CM | POA: Diagnosis not present

## 2021-12-16 DIAGNOSIS — C184 Malignant neoplasm of transverse colon: Secondary | ICD-10-CM | POA: Insufficient documentation

## 2021-12-16 DIAGNOSIS — R001 Bradycardia, unspecified: Secondary | ICD-10-CM

## 2021-12-16 DIAGNOSIS — E785 Hyperlipidemia, unspecified: Secondary | ICD-10-CM | POA: Insufficient documentation

## 2021-12-16 DIAGNOSIS — Z8619 Personal history of other infectious and parasitic diseases: Secondary | ICD-10-CM | POA: Insufficient documentation

## 2021-12-16 LAB — CBC WITH DIFFERENTIAL (CANCER CENTER ONLY)
Abs Immature Granulocytes: 0.1 10*3/uL — ABNORMAL HIGH (ref 0.00–0.07)
Basophils Absolute: 0.1 10*3/uL (ref 0.0–0.1)
Basophils Relative: 1 %
Eosinophils Absolute: 0.3 10*3/uL (ref 0.0–0.5)
Eosinophils Relative: 5 %
HCT: 38.8 % — ABNORMAL LOW (ref 39.0–52.0)
Hemoglobin: 13 g/dL (ref 13.0–17.0)
Immature Granulocytes: 1 %
Lymphocytes Relative: 26 %
Lymphs Abs: 1.8 10*3/uL (ref 0.7–4.0)
MCH: 30 pg (ref 26.0–34.0)
MCHC: 33.5 g/dL (ref 30.0–36.0)
MCV: 89.6 fL (ref 80.0–100.0)
Monocytes Absolute: 0.5 10*3/uL (ref 0.1–1.0)
Monocytes Relative: 7 %
Neutro Abs: 4.4 10*3/uL (ref 1.7–7.7)
Neutrophils Relative %: 60 %
Platelet Count: 246 10*3/uL (ref 150–400)
RBC: 4.33 MIL/uL (ref 4.22–5.81)
RDW: 23.8 % — ABNORMAL HIGH (ref 11.5–15.5)
WBC Count: 7.2 10*3/uL (ref 4.0–10.5)
nRBC: 0 % (ref 0.0–0.2)

## 2021-12-16 LAB — CMP (CANCER CENTER ONLY)
ALT: 11 U/L (ref 0–44)
AST: 15 U/L (ref 15–41)
Albumin: 3.9 g/dL (ref 3.5–5.0)
Alkaline Phosphatase: 46 U/L (ref 38–126)
Anion gap: 11 (ref 5–15)
BUN: 15 mg/dL (ref 8–23)
CO2: 25 mmol/L (ref 22–32)
Calcium: 9.3 mg/dL (ref 8.9–10.3)
Chloride: 105 mmol/L (ref 98–111)
Creatinine: 1.06 mg/dL (ref 0.61–1.24)
GFR, Estimated: >60 mL/min (ref >60)
Glucose, Bld: 155 mg/dL — ABNORMAL HIGH (ref 70–99)
Potassium: 4 mmol/L (ref 3.5–5.1)
Sodium: 141 mmol/L (ref 135–145)
Total Bilirubin: 0.3 mg/dL (ref 0.3–1.2)
Total Protein: 6.3 g/dL — ABNORMAL LOW (ref 6.5–8.1)

## 2021-12-16 MED ORDER — CAPECITABINE 500 MG PO TABS
ORAL_TABLET | ORAL | 0 refills | Status: DC
  Filled 2021-12-16: qty 98, 21d supply, fill #0

## 2021-12-16 NOTE — Progress Notes (Signed)
  [place] OFFICE PROGRESS NOTE   Diagnosis: Colon cancer  INTERVAL HISTORY:   [name] completed another cycle Xeloda beginning 11/28/2021.  No mouth sores, diarrhea, or hand/foot pain.  He has noted dryness and skin thickening at the soles.  Skin thickening at the hands.  He complains of malaise.  He has developed a hyperpigmented rash over the arms.  Objective:  Vital signs in last 24 hours:  Blood pressure 130/66, pulse (!) 55, temperature 98.2 F (36.8 C), temperature source Oral, resp. rate 20, height $RemoveBe'5\' 6"'nFxUYSEru$  (1.676 m), weight 216 lb 3.2 oz (98.1 kg), SpO2 100 %.    HEENT: No thrush or ulcers Resp: Lungs clear bilaterally Cardio: Distant heart sounds, regular rhythm, bradycardia GI: No hepatosplenomegaly, mild tenderness in the right upper abdomen Vascular: No leg edema  Skin: Mild skin thickening at the distal fingers, dryness and callus formation at the soles, hypertrophy of the toenails, hyperpigmentation in a sun distribution at the lower arm bilaterally.  Dryness and maculopapular rash bilaterally   Lab Results:  Lab Results  Component Value Date   WBC 7.2 12/16/2021   HGB 13.0 12/16/2021   HCT 38.8 (L) 12/16/2021   MCV 89.6 12/16/2021   PLT 246 12/16/2021   NEUTROABS 4.4 12/16/2021    CMP  Lab Results  Component Value Date   NA 141 12/16/2021   K 4.0 12/16/2021   CL 105 12/16/2021   CO2 25 12/16/2021   GLUCOSE 155 (H) 12/16/2021   BUN 15 12/16/2021   CREATININE 1.06 12/16/2021   CALCIUM 9.3 12/16/2021   PROT 6.3 (L) 12/16/2021   ALBUMIN 3.9 12/16/2021   AST 15 12/16/2021   ALT 11 12/16/2021   ALKPHOS 46 12/16/2021   BILITOT 0.3 12/16/2021   GFRNONAA >60 12/16/2021   GFRAA  12/11/2007    >60        The eGFR has been calculated using the MDRD equation. This calculation has not been validated in all clinical    Lab Results  Component Value Date   CEA 2.66 10/19/2021   Medications: I have reviewed the patient's current  medications.   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 Transverse colon mass noted on colonoscopy 3/29/202-invasive moderately differentiated adenocarcinoma, no loss of mismatch repair protein expression CTs 08/08/2021-left low axillary chest wall nodule measuring 1.1 cm, postoperative findings in the left colon, complex ventral hernia Transverse colon cancer 09/16/2021-MSS, no loss of mismatch repair protein expression, lymphovascular invasion present, no tumor deposits Cycle 1 Xeloda 11/07/2021 Cycle 2 Xeloda 11/28/2021 Cycle 3 Xeloda 12/19/2021 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 difficile, 10-day course of vancomycin completed   Disposition: Mr. Hardge has completed 2 cycles of Xeloda.  He is tolerated the Xeloda well.  He has developed a rash over the forearms.  I cautioned him to avoid sun exposure.  He will complete another cycle of Xeloda beginning 12/19/2021.  He will return for an office and lab visit in 3 weeks.    Betsy Coder, MD  12/16/2021  9:24 AM

## 2021-12-21 DIAGNOSIS — E782 Mixed hyperlipidemia: Secondary | ICD-10-CM | POA: Diagnosis not present

## 2021-12-21 DIAGNOSIS — I700 Atherosclerosis of aorta: Secondary | ICD-10-CM | POA: Diagnosis not present

## 2021-12-21 DIAGNOSIS — C189 Malignant neoplasm of colon, unspecified: Secondary | ICD-10-CM | POA: Diagnosis not present

## 2021-12-21 DIAGNOSIS — E1169 Type 2 diabetes mellitus with other specified complication: Secondary | ICD-10-CM | POA: Diagnosis not present

## 2021-12-21 DIAGNOSIS — I10 Essential (primary) hypertension: Secondary | ICD-10-CM | POA: Diagnosis not present

## 2021-12-29 ENCOUNTER — Other Ambulatory Visit (HOSPITAL_COMMUNITY): Payer: Self-pay

## 2021-12-29 ENCOUNTER — Other Ambulatory Visit: Payer: Self-pay | Admitting: Oncology

## 2021-12-29 DIAGNOSIS — C184 Malignant neoplasm of transverse colon: Secondary | ICD-10-CM

## 2021-12-29 MED ORDER — CAPECITABINE 500 MG PO TABS
ORAL_TABLET | ORAL | 0 refills | Status: DC
  Filled 2021-12-29: qty 98, 21d supply, fill #0

## 2022-01-02 ENCOUNTER — Other Ambulatory Visit (HOSPITAL_COMMUNITY): Payer: Self-pay

## 2022-01-06 ENCOUNTER — Inpatient Hospital Stay: Payer: Medicare Other | Attending: Oncology

## 2022-01-06 ENCOUNTER — Inpatient Hospital Stay: Payer: Medicare Other | Admitting: Oncology

## 2022-01-06 VITALS — BP 126/63 | HR 70 | Temp 98.1°F | Resp 18 | Ht 66.0 in | Wt 215.4 lb

## 2022-01-06 DIAGNOSIS — R197 Diarrhea, unspecified: Secondary | ICD-10-CM | POA: Diagnosis not present

## 2022-01-06 DIAGNOSIS — C184 Malignant neoplasm of transverse colon: Secondary | ICD-10-CM | POA: Diagnosis not present

## 2022-01-06 DIAGNOSIS — E785 Hyperlipidemia, unspecified: Secondary | ICD-10-CM | POA: Insufficient documentation

## 2022-01-06 DIAGNOSIS — L271 Localized skin eruption due to drugs and medicaments taken internally: Secondary | ICD-10-CM | POA: Insufficient documentation

## 2022-01-06 DIAGNOSIS — I10 Essential (primary) hypertension: Secondary | ICD-10-CM | POA: Insufficient documentation

## 2022-01-06 LAB — CMP (CANCER CENTER ONLY)
ALT: 13 U/L (ref 0–44)
AST: 18 U/L (ref 15–41)
Albumin: 4.4 g/dL (ref 3.5–5.0)
Alkaline Phosphatase: 45 U/L (ref 38–126)
Anion gap: 8 (ref 5–15)
BUN: 16 mg/dL (ref 8–23)
CO2: 28 mmol/L (ref 22–32)
Calcium: 9.7 mg/dL (ref 8.9–10.3)
Chloride: 104 mmol/L (ref 98–111)
Creatinine: 0.99 mg/dL (ref 0.61–1.24)
GFR, Estimated: >60 mL/min (ref >60)
Glucose, Bld: 184 mg/dL — ABNORMAL HIGH (ref 70–99)
Potassium: 3.5 mmol/L (ref 3.5–5.1)
Sodium: 140 mmol/L (ref 135–145)
Total Bilirubin: 0.4 mg/dL (ref 0.3–1.2)
Total Protein: 6.6 g/dL (ref 6.5–8.1)

## 2022-01-06 LAB — CBC WITH DIFFERENTIAL (CANCER CENTER ONLY)
Abs Immature Granulocytes: 0.19 10*3/uL — ABNORMAL HIGH (ref 0.00–0.07)
Basophils Absolute: 0.1 10*3/uL (ref 0.0–0.1)
Basophils Relative: 1 %
Eosinophils Absolute: 0.2 10*3/uL (ref 0.0–0.5)
Eosinophils Relative: 3 %
HCT: 39.7 % (ref 39.0–52.0)
Hemoglobin: 13.5 g/dL (ref 13.0–17.0)
Immature Granulocytes: 2 %
Lymphocytes Relative: 24 %
Lymphs Abs: 1.9 10*3/uL (ref 0.7–4.0)
MCH: 31.8 pg (ref 26.0–34.0)
MCHC: 34 g/dL (ref 30.0–36.0)
MCV: 93.6 fL (ref 80.0–100.0)
Monocytes Absolute: 0.6 10*3/uL (ref 0.1–1.0)
Monocytes Relative: 7 %
Neutro Abs: 5.1 10*3/uL (ref 1.7–7.7)
Neutrophils Relative %: 63 %
Platelet Count: 242 10*3/uL (ref 150–400)
RBC: 4.24 MIL/uL (ref 4.22–5.81)
RDW: 24.2 % — ABNORMAL HIGH (ref 11.5–15.5)
WBC Count: 8.1 10*3/uL (ref 4.0–10.5)
nRBC: 0 % (ref 0.0–0.2)

## 2022-01-06 LAB — CEA (ACCESS): CEA (CHCC): 3.96 ng/mL (ref 0.00–5.00)

## 2022-01-06 NOTE — Progress Notes (Signed)
  [place] OFFICE PROGRESS NOTE   Diagnosis: Colon cancer  INTERVAL HISTORY:   [name] completed another cycle Xeloda beginning 12/19/2021.  No mouth sores, nausea, or diarrhea.  The feet are dry.  No pain or numbness.  Objective:  Vital signs in last 24 hours:  Blood pressure 126/63, pulse 70, temperature 98.1 F (36.7 C), temperature source Oral, resp. rate 18, height $RemoveBe'5\' 6"'YOTGJMOtk$  (1.676 m), weight 215 lb 6.4 oz (97.7 kg), SpO2 100 %.    HEENT: No thrush or ulcers Resp: Lungs clear bilaterally Cardio: Regular rate and rhythm GI: No hepatosplenomegaly Vascular: No leg edema  Skin: Hyperpigmented moles over the head, erythematous rash at the forearm bilaterally, palms without erythema.  Skin thickening and dryness with callus formation at the soles, mild erythema at the soles   Lab Results:  Lab Results  Component Value Date   WBC 8.1 01/06/2022   HGB 13.5 01/06/2022   HCT 39.7 01/06/2022   MCV 93.6 01/06/2022   PLT 242 01/06/2022   NEUTROABS 5.1 01/06/2022    CMP  Lab Results  Component Value Date   NA 141 12/16/2021   K 4.0 12/16/2021   CL 105 12/16/2021   CO2 25 12/16/2021   GLUCOSE 155 (H) 12/16/2021   BUN 15 12/16/2021   CREATININE 1.06 12/16/2021   CALCIUM 9.3 12/16/2021   PROT 6.3 (L) 12/16/2021   ALBUMIN 3.9 12/16/2021   AST 15 12/16/2021   ALT 11 12/16/2021   ALKPHOS 46 12/16/2021   BILITOT 0.3 12/16/2021   GFRNONAA >60 12/16/2021   GFRAA  12/11/2007    >60        The eGFR has been calculated using the MDRD equation. This calculation has not been validated in all clinical    Lab Results  Component Value Date   CEA 2.66 10/19/2021     Medications: I have reviewed the patient's current medications.   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/16/2021 Transverse colon mass noted on colonoscopy 3/29/202-invasive moderately differentiated adenocarcinoma, no loss  of mismatch repair protein expression CTs 08/08/2021-left low axillary chest wall nodule measuring 1.1 cm, postoperative findings in the left colon, complex ventral hernia Transverse colon cancer 09/16/2021-MSS, no loss of mismatch repair protein expression, lymphovascular invasion present, no tumor deposits Cycle 1 Xeloda 11/07/2021 Cycle 2 Xeloda 11/28/2021 Cycle 3 Xeloda 12/19/2021 Cycle 4 Xeloda 01/09/2022 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 difficile, 10-day course of vancomycin completed     Disposition: [name] has completed 3 cycles of Xeloda.  He is tolerating Xeloda well.  He has a mild rash at the forearm and mild hand/foot syndrome.  I encouraged him to avoid heavy sun exposure.  He will complete another cycle of Xeloda beginning 01/09/2022.  He will return for an office and lab visit in 3 weeks.  He will call in the interim for new symptoms.  Betsy Coder, MD  01/06/2022  10:01 AM

## 2022-01-11 ENCOUNTER — Encounter: Payer: Self-pay | Admitting: Podiatry

## 2022-01-11 ENCOUNTER — Ambulatory Visit: Payer: Medicare Other | Admitting: Podiatry

## 2022-01-11 DIAGNOSIS — B351 Tinea unguium: Secondary | ICD-10-CM | POA: Diagnosis not present

## 2022-01-11 DIAGNOSIS — M79675 Pain in left toe(s): Secondary | ICD-10-CM

## 2022-01-11 DIAGNOSIS — M79674 Pain in right toe(s): Secondary | ICD-10-CM

## 2022-01-11 NOTE — Progress Notes (Signed)
This patient presents to the office with chief complaint of long thick painful nails.  Patient says the nails are painful walking and wearing shoes.  This patient is unable to self treat.  This patient is unable to trim his nails since he is unable to reach his nails.   Patient is under treatment for cancer and his doctor told him to get his nails worked on by a podiatrist.   He presents to the office for preventative foot care services.  General Appearance  Alert, conversant and in no acute stress.  Vascular  Dorsalis pedis and posterior tibial  pulses are palpable  bilaterally.  Capillary return is within normal limits  bilaterally. Temperature is within normal limits  bilaterally.  Neurologic  Senn-Weinstein monofilament wire test within normal limits  bilaterally. Muscle power within normal limits bilaterally.  Nails Thick disfigured discolored nails with subungual debris  from hallux to fifth toes bilaterally. No evidence of bacterial infection or drainage bilaterally.  Orthopedic  No limitations of motion  feet .  No crepitus or effusions noted.  No bony pathology or digital deformities noted.  Skin  normotropic skin with no porokeratosis noted bilaterally.  No signs of infections or ulcers noted.     Onychomycosis  Nails  B/L.  Pain in right toes  Pain in left toes  Debridement of nails both feet followed trimming the nails with dremel   prn   Gardiner Barefoot DPM

## 2022-01-17 ENCOUNTER — Other Ambulatory Visit: Payer: Self-pay | Admitting: Oncology

## 2022-01-17 ENCOUNTER — Other Ambulatory Visit (HOSPITAL_COMMUNITY): Payer: Self-pay

## 2022-01-17 DIAGNOSIS — C184 Malignant neoplasm of transverse colon: Secondary | ICD-10-CM

## 2022-01-17 MED ORDER — CAPECITABINE 500 MG PO TABS
ORAL_TABLET | ORAL | 0 refills | Status: DC
  Filled 2022-01-17: qty 98, 21d supply, fill #0

## 2022-01-23 ENCOUNTER — Other Ambulatory Visit (HOSPITAL_COMMUNITY): Payer: Self-pay

## 2022-01-26 ENCOUNTER — Inpatient Hospital Stay: Payer: Medicare Other

## 2022-01-26 ENCOUNTER — Encounter: Payer: Self-pay | Admitting: Nurse Practitioner

## 2022-01-26 ENCOUNTER — Inpatient Hospital Stay: Payer: Medicare Other | Admitting: Nurse Practitioner

## 2022-01-26 VITALS — BP 132/62 | HR 86 | Temp 98.1°F | Resp 18 | Ht 66.0 in | Wt 215.4 lb

## 2022-01-26 DIAGNOSIS — E785 Hyperlipidemia, unspecified: Secondary | ICD-10-CM | POA: Diagnosis not present

## 2022-01-26 DIAGNOSIS — R197 Diarrhea, unspecified: Secondary | ICD-10-CM | POA: Diagnosis not present

## 2022-01-26 DIAGNOSIS — C184 Malignant neoplasm of transverse colon: Secondary | ICD-10-CM

## 2022-01-26 DIAGNOSIS — L271 Localized skin eruption due to drugs and medicaments taken internally: Secondary | ICD-10-CM | POA: Diagnosis not present

## 2022-01-26 DIAGNOSIS — I10 Essential (primary) hypertension: Secondary | ICD-10-CM | POA: Diagnosis not present

## 2022-01-26 LAB — CMP (CANCER CENTER ONLY)
ALT: 12 U/L (ref 0–44)
AST: 26 U/L (ref 15–41)
Albumin: 4.4 g/dL (ref 3.5–5.0)
Alkaline Phosphatase: 51 U/L (ref 38–126)
Anion gap: 9 (ref 5–15)
BUN: 16 mg/dL (ref 8–23)
CO2: 26 mmol/L (ref 22–32)
Calcium: 9.7 mg/dL (ref 8.9–10.3)
Chloride: 105 mmol/L (ref 98–111)
Creatinine: 1.05 mg/dL (ref 0.61–1.24)
GFR, Estimated: >60 mL/min (ref >60)
Glucose, Bld: 136 mg/dL — ABNORMAL HIGH (ref 70–99)
Potassium: 4.3 mmol/L (ref 3.5–5.1)
Sodium: 140 mmol/L (ref 135–145)
Total Bilirubin: 0.4 mg/dL (ref 0.3–1.2)
Total Protein: 6.6 g/dL (ref 6.5–8.1)

## 2022-01-26 LAB — CBC WITH DIFFERENTIAL (CANCER CENTER ONLY)
Abs Immature Granulocytes: 0.17 10*3/uL — ABNORMAL HIGH (ref 0.00–0.07)
Basophils Absolute: 0.1 10*3/uL (ref 0.0–0.1)
Basophils Relative: 1 %
Eosinophils Absolute: 0.3 10*3/uL (ref 0.0–0.5)
Eosinophils Relative: 4 %
HCT: 41 % (ref 39.0–52.0)
Hemoglobin: 14.3 g/dL (ref 13.0–17.0)
Immature Granulocytes: 2 %
Lymphocytes Relative: 21 %
Lymphs Abs: 1.7 10*3/uL (ref 0.7–4.0)
MCH: 33.6 pg (ref 26.0–34.0)
MCHC: 34.9 g/dL (ref 30.0–36.0)
MCV: 96.2 fL (ref 80.0–100.0)
Monocytes Absolute: 0.7 10*3/uL (ref 0.1–1.0)
Monocytes Relative: 8 %
Neutro Abs: 5.1 10*3/uL (ref 1.7–7.7)
Neutrophils Relative %: 64 %
Platelet Count: 237 10*3/uL (ref 150–400)
RBC: 4.26 MIL/uL (ref 4.22–5.81)
RDW: 23.4 % — ABNORMAL HIGH (ref 11.5–15.5)
WBC Count: 8.1 10*3/uL (ref 4.0–10.5)
nRBC: 0 % (ref 0.0–0.2)

## 2022-01-26 NOTE — Progress Notes (Signed)
  [place] OFFICE PROGRESS NOTE   Diagnosis: Colon cancer  INTERVAL HISTORY:   [name] returns as scheduled.  He completed cycle 4 Xeloda beginning 01/09/2022.  He denies nausea/vomiting.  No mouth sores.  No diarrhea.  He noted itching on the tops of his feet several days last week.  He has pain at the tip of the third toe on his right foot.  He had a recent visit with his podiatrist.  No pain on the palms or soles.  He developed significant constipation recently, requiring several laxatives.  Bowels now moving regularly.  Objective:  Vital signs in last 24 hours:  Blood pressure 132/62, pulse 86, temperature 98.1 F (36.7 C), temperature source Oral, resp. rate 18, height 5' 6" (1.676 m), weight 215 lb 6.4 oz (97.7 kg), SpO2 100 %.    HEENT: No thrush or ulcers. Resp: Lungs clear bilaterally. Cardio: Regular rate and rhythm. GI: No hepatomegaly. Vascular: No leg edema. Neuro: Alert and oriented. Skin: Erythematous rash at the forearm bilaterally.  Palms without erythema.  Mild erythema, skin thickening and dryness at the soles.  Right third toenail is loose.  The nail is very thickened.  No drainage.   Lab Results:  Lab Results  Component Value Date   WBC 8.1 01/26/2022   HGB 14.3 01/26/2022   HCT 41.0 01/26/2022   MCV 96.2 01/26/2022   PLT 237 01/26/2022   NEUTROABS 5.1 01/26/2022    Imaging:  No results found.  Medications: I have reviewed the patient's current medications.  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/16/2021 Transverse colon mass noted on colonoscopy 3/29/202-invasive moderately differentiated adenocarcinoma, no loss of mismatch repair protein expression CTs 08/08/2021-left low axillary chest wall nodule measuring 1.1 cm, postoperative findings in the left colon, complex ventral hernia Transverse colon cancer 09/16/2021-MSS, no loss of mismatch repair protein  expression, lymphovascular invasion present, no tumor deposits Cycle 1 Xeloda 11/07/2021 Cycle 2 Xeloda 11/28/2021 Cycle 3 Xeloda 12/19/2021 Cycle 4 Xeloda 01/09/2022 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 difficile, 10-day course of vancomycin completed      Disposition: [name] appears stable.  He has completed 4 cycles of adjuvant Xeloda.  He has progressive hand-foot syndrome.  We decided to place Xeloda on hold and reevaluate in 1 week.  He agrees with this plan.  We will see him in follow-up on 02/02/2022.    Ned Card ANP/GNP-BC   01/26/2022  10:28 AM

## 2022-02-02 ENCOUNTER — Encounter: Payer: Self-pay | Admitting: Nurse Practitioner

## 2022-02-02 ENCOUNTER — Inpatient Hospital Stay: Payer: Medicare Other | Admitting: Nurse Practitioner

## 2022-02-02 VITALS — BP 116/59 | HR 62 | Temp 98.2°F | Resp 20 | Ht 66.0 in | Wt 214.0 lb

## 2022-02-02 DIAGNOSIS — L271 Localized skin eruption due to drugs and medicaments taken internally: Secondary | ICD-10-CM | POA: Diagnosis not present

## 2022-02-02 DIAGNOSIS — E785 Hyperlipidemia, unspecified: Secondary | ICD-10-CM | POA: Diagnosis not present

## 2022-02-02 DIAGNOSIS — C184 Malignant neoplasm of transverse colon: Secondary | ICD-10-CM | POA: Diagnosis not present

## 2022-02-02 DIAGNOSIS — R197 Diarrhea, unspecified: Secondary | ICD-10-CM | POA: Diagnosis not present

## 2022-02-02 DIAGNOSIS — I10 Essential (primary) hypertension: Secondary | ICD-10-CM | POA: Diagnosis not present

## 2022-02-02 NOTE — Progress Notes (Signed)
  [place] OFFICE PROGRESS NOTE   Diagnosis: Colon cancer  INTERVAL HISTORY:   [name] returns as scheduled.  He has completed 4 cycles of Xeloda.  Cycle 5 was held last week due to progressive hand-foot syndrome.  He is seen today for reevaluation.  He reports hand-and-foot symptoms are "no worse".  He is applying lotion liberally.  No pain over the palms or soles.  A few areas of peeling skin on the soles.  Right third toe is less painful.  Objective:  Vital signs in last 24 hours:  Blood pressure (!) 116/59, pulse 62, temperature 98.2 F (36.8 C), temperature source Oral, resp. rate 20, height $RemoveBe'5\' 6"'DLbEnoinm$  (1.676 m), weight 214 lb (97.1 kg), SpO2 99 %.    HEENT: No thrush or ulcers. Resp: Lungs clear bilaterally. Cardio: Regular rate and rhythm. GI: No hepatosplenomegaly. Vascular: No leg edema. Skin: Palms with skin thickening, mild dryness.  Soles with skin thickening, mild erythema, dryness at the heels.   Lab Results:  Lab Results  Component Value Date   WBC 8.1 01/26/2022   HGB 14.3 01/26/2022   HCT 41.0 01/26/2022   MCV 96.2 01/26/2022   PLT 237 01/26/2022   NEUTROABS 5.1 01/26/2022    Imaging:  No results found.  Medications: I have reviewed the patient's current medications.  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/16/2021 Transverse colon mass noted on colonoscopy 3/29/202-invasive moderately differentiated adenocarcinoma, no loss of mismatch repair protein expression CTs 08/08/2021-left low axillary chest wall nodule measuring 1.1 cm, postoperative findings in the left colon, complex ventral hernia Transverse colon cancer 09/16/2021-MSS, no loss of mismatch repair protein expression, lymphovascular invasion present, no tumor deposits Cycle 1 Xeloda 11/07/2021 Cycle 2 Xeloda 11/28/2021 Cycle 3 Xeloda 12/19/2021 Cycle 4 Xeloda 01/09/2022 Cycle 5 held due to progressive hand-foot  syndrome Cycle 5 Xeloda 02/06/2022, Xeloda dose adjusted to 1500 mg twice daily for 14 days followed by 7-day break, Voltaren gel initiated 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 difficile, 10-day course of vancomycin completed  Disposition: [name] appears stable.  He has completed 4 cycles of Xeloda.  Cycle 5 was held last week due to progressive changes of hand-foot syndrome.  Hands and feet appear improved today.  He will begin cycle 5 Xeloda on 02/06/2022 with dose adjusted to 1500 mg twice daily for 14 days.  He will begin Voltaren gel, small amount, twice daily on the palms and soles.  We will see him in follow-up in 3 weeks.  He will contact the office in the interim with any problems.  We specifically discussed worsening hand-foot symptoms.    Ned Card ANP/GNP-BC   02/02/2022  1:57 PM

## 2022-02-08 ENCOUNTER — Other Ambulatory Visit (HOSPITAL_COMMUNITY): Payer: Self-pay

## 2022-02-10 ENCOUNTER — Other Ambulatory Visit: Payer: Self-pay | Admitting: Oncology

## 2022-02-10 ENCOUNTER — Other Ambulatory Visit (HOSPITAL_COMMUNITY): Payer: Self-pay

## 2022-02-10 DIAGNOSIS — C184 Malignant neoplasm of transverse colon: Secondary | ICD-10-CM

## 2022-02-10 NOTE — Telephone Encounter (Signed)
Last cycle started on 02/06/22 at reduced dose of 1500 mg bid. Will refill after MD evaluation on 02/23/22

## 2022-02-11 DIAGNOSIS — G4733 Obstructive sleep apnea (adult) (pediatric): Secondary | ICD-10-CM | POA: Diagnosis not present

## 2022-02-17 ENCOUNTER — Telehealth: Payer: Self-pay | Admitting: *Deleted

## 2022-02-17 NOTE — Telephone Encounter (Signed)
Error

## 2022-02-20 ENCOUNTER — Other Ambulatory Visit (HOSPITAL_COMMUNITY): Payer: Self-pay

## 2022-02-23 ENCOUNTER — Inpatient Hospital Stay: Payer: Medicare Other | Attending: Oncology

## 2022-02-23 ENCOUNTER — Other Ambulatory Visit (HOSPITAL_COMMUNITY): Payer: Self-pay

## 2022-02-23 ENCOUNTER — Encounter: Payer: Self-pay | Admitting: Nurse Practitioner

## 2022-02-23 ENCOUNTER — Inpatient Hospital Stay: Payer: Medicare Other | Admitting: Nurse Practitioner

## 2022-02-23 VITALS — BP 127/66 | HR 85 | Temp 98.2°F | Resp 18 | Ht 66.0 in | Wt 217.2 lb

## 2022-02-23 DIAGNOSIS — L271 Localized skin eruption due to drugs and medicaments taken internally: Secondary | ICD-10-CM | POA: Insufficient documentation

## 2022-02-23 DIAGNOSIS — I10 Essential (primary) hypertension: Secondary | ICD-10-CM | POA: Diagnosis not present

## 2022-02-23 DIAGNOSIS — C184 Malignant neoplasm of transverse colon: Secondary | ICD-10-CM | POA: Diagnosis not present

## 2022-02-23 DIAGNOSIS — E785 Hyperlipidemia, unspecified: Secondary | ICD-10-CM | POA: Insufficient documentation

## 2022-02-23 LAB — CBC WITH DIFFERENTIAL (CANCER CENTER ONLY)
Abs Immature Granulocytes: 0.07 10*3/uL (ref 0.00–0.07)
Basophils Absolute: 0.1 10*3/uL (ref 0.0–0.1)
Basophils Relative: 1 %
Eosinophils Absolute: 0.2 10*3/uL (ref 0.0–0.5)
Eosinophils Relative: 3 %
HCT: 41.3 % (ref 39.0–52.0)
Hemoglobin: 14.4 g/dL (ref 13.0–17.0)
Immature Granulocytes: 1 %
Lymphocytes Relative: 27 %
Lymphs Abs: 2.2 10*3/uL (ref 0.7–4.0)
MCH: 34 pg (ref 26.0–34.0)
MCHC: 34.9 g/dL (ref 30.0–36.0)
MCV: 97.4 fL (ref 80.0–100.0)
Monocytes Absolute: 0.5 10*3/uL (ref 0.1–1.0)
Monocytes Relative: 7 %
Neutro Abs: 4.8 10*3/uL (ref 1.7–7.7)
Neutrophils Relative %: 61 %
Platelet Count: 238 10*3/uL (ref 150–400)
RBC: 4.24 MIL/uL (ref 4.22–5.81)
RDW: 18.9 % — ABNORMAL HIGH (ref 11.5–15.5)
WBC Count: 7.9 10*3/uL (ref 4.0–10.5)
nRBC: 0 % (ref 0.0–0.2)

## 2022-02-23 LAB — CMP (CANCER CENTER ONLY)
ALT: 14 U/L (ref 0–44)
AST: 19 U/L (ref 15–41)
Albumin: 4.3 g/dL (ref 3.5–5.0)
Alkaline Phosphatase: 43 U/L (ref 38–126)
Anion gap: 6 (ref 5–15)
BUN: 12 mg/dL (ref 8–23)
CO2: 28 mmol/L (ref 22–32)
Calcium: 9.9 mg/dL (ref 8.9–10.3)
Chloride: 104 mmol/L (ref 98–111)
Creatinine: 1.09 mg/dL (ref 0.61–1.24)
GFR, Estimated: >60 mL/min (ref >60)
Glucose, Bld: 127 mg/dL — ABNORMAL HIGH (ref 70–99)
Potassium: 4.3 mmol/L (ref 3.5–5.1)
Sodium: 138 mmol/L (ref 135–145)
Total Bilirubin: 0.5 mg/dL (ref 0.3–1.2)
Total Protein: 6.6 g/dL (ref 6.5–8.1)

## 2022-02-23 MED ORDER — CAPECITABINE 500 MG PO TABS
1500.0000 mg | ORAL_TABLET | Freq: Two times a day (BID) | ORAL | 0 refills | Status: DC
  Filled 2022-02-23: qty 84, 21d supply, fill #0

## 2022-02-23 NOTE — Progress Notes (Signed)
  [place] OFFICE PROGRESS NOTE   Diagnosis: Colon cancer  INTERVAL HISTORY:   [name] returns as scheduled.  He completed cycle 5 Xeloda beginning 02/06/2022.  The dose was adjusted and Voltaren gel initiated due to progressive hand-foot syndrome.  He did not begin the Voltaren gel.  Hands and feet feel better.  He denies pain and redness.  He notes feet are dry today but thinks this is because he did not apply lotion.  No nausea or vomiting.  No mouth sores.  No diarrhea.  Objective:  Vital signs in last 24 hours:  Blood pressure 127/66, pulse 85, temperature 98.2 F (36.8 C), temperature source Oral, resp. rate 18, height _0  (1.676 m), weight 217 lb 3.2 oz (98.5 kg), SpO2 100 %.    HEENT: No thrush or ulcers. Resp: Lungs clear bilaterally. Cardio: Regular rate and rhythm. GI: Abdomen soft and nontender.  No hepatosplenomegaly. Vascular: No leg edema. Skin: Palms with skin thickening, no erythema or skin breakdown.  Soles with skin thickening, mild erythema, dryness mainly at the heels.   Lab Results:  Lab Results  Component Value Date   WBC 7.9 02/23/2022   HGB 14.4 02/23/2022   HCT 41.3 02/23/2022   MCV 97.4 02/23/2022   PLT 238 02/23/2022   NEUTROABS 4.8 02/23/2022    Imaging:  No results found.  Medications: I have reviewed the patient's current medications.  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/16/2021 Transverse colon mass noted on colonoscopy 3/29/202-invasive moderately differentiated adenocarcinoma, no loss of mismatch repair protein expression CTs 08/08/2021-left low axillary chest wall nodule measuring 1.1 cm, postoperative findings in the left colon, complex ventral hernia Transverse colon cancer 09/16/2021-MSS, no loss of mismatch repair protein expression, lymphovascular invasion present, no tumor deposits Cycle 1 Xeloda 11/07/2021 Cycle 2 Xeloda 11/28/2021 Cycle  3 Xeloda 12/19/2021 Cycle 4 Xeloda 01/09/2022 Cycle 5 held due to progressive hand-foot syndrome Cycle 5 Xeloda 02/06/2022, Xeloda dose adjusted to 1500 mg twice daily for 14 days followed by 7-day break, Voltaren gel initiated-he did not begin Voltaren gel 02/23/2022 Cycle 6 Xeloda 02/27/2022 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 difficile, 10-day course of vancomycin completed  Disposition: [name] appears stable.  He has completed 5 cycles of Xeloda.  Xeloda was dose reduced with cycle 5 due to hand-foot syndrome.  Hands and feet appear improved.  Plan to proceed with cycle 6 as scheduled beginning 02/27/2022, same dose as with cycle 5.  CBC and chemistry panel reviewed.  Labs adequate to proceed as above.  He will return for lab and follow-up in 3 weeks.    Ned Card ANP/GNP-BC   02/23/2022  1:48 PM

## 2022-03-02 DIAGNOSIS — E119 Type 2 diabetes mellitus without complications: Secondary | ICD-10-CM | POA: Diagnosis not present

## 2022-03-02 DIAGNOSIS — H401122 Primary open-angle glaucoma, left eye, moderate stage: Secondary | ICD-10-CM | POA: Diagnosis not present

## 2022-03-02 DIAGNOSIS — H59811 Chorioretinal scars after surgery for detachment, right eye: Secondary | ICD-10-CM | POA: Diagnosis not present

## 2022-03-02 DIAGNOSIS — H40021 Open angle with borderline findings, high risk, right eye: Secondary | ICD-10-CM | POA: Diagnosis not present

## 2022-03-02 DIAGNOSIS — H35352 Cystoid macular degeneration, left eye: Secondary | ICD-10-CM | POA: Diagnosis not present

## 2022-03-02 DIAGNOSIS — H35373 Puckering of macula, bilateral: Secondary | ICD-10-CM | POA: Diagnosis not present

## 2022-03-02 DIAGNOSIS — H04123 Dry eye syndrome of bilateral lacrimal glands: Secondary | ICD-10-CM | POA: Diagnosis not present

## 2022-03-08 ENCOUNTER — Other Ambulatory Visit (HOSPITAL_COMMUNITY): Payer: Self-pay

## 2022-03-09 ENCOUNTER — Other Ambulatory Visit (HOSPITAL_COMMUNITY): Payer: Self-pay

## 2022-03-09 ENCOUNTER — Other Ambulatory Visit: Payer: Self-pay | Admitting: Oncology

## 2022-03-09 DIAGNOSIS — C184 Malignant neoplasm of transverse colon: Secondary | ICD-10-CM

## 2022-03-09 MED ORDER — CAPECITABINE 500 MG PO TABS
1500.0000 mg | ORAL_TABLET | Freq: Two times a day (BID) | ORAL | 0 refills | Status: DC
  Filled 2022-03-09: qty 84, 21d supply, fill #0

## 2022-03-17 ENCOUNTER — Inpatient Hospital Stay: Payer: Medicare Other | Admitting: Oncology

## 2022-03-17 ENCOUNTER — Encounter: Payer: Self-pay | Admitting: Oncology

## 2022-03-17 ENCOUNTER — Inpatient Hospital Stay: Payer: Medicare Other | Attending: Oncology

## 2022-03-17 ENCOUNTER — Other Ambulatory Visit (HOSPITAL_COMMUNITY): Payer: Self-pay

## 2022-03-17 VITALS — BP 136/72 | HR 66 | Temp 98.2°F | Resp 18 | Ht 66.0 in | Wt 221.0 lb

## 2022-03-17 DIAGNOSIS — C184 Malignant neoplasm of transverse colon: Secondary | ICD-10-CM

## 2022-03-17 DIAGNOSIS — L271 Localized skin eruption due to drugs and medicaments taken internally: Secondary | ICD-10-CM | POA: Insufficient documentation

## 2022-03-17 DIAGNOSIS — Z23 Encounter for immunization: Secondary | ICD-10-CM | POA: Insufficient documentation

## 2022-03-17 DIAGNOSIS — I10 Essential (primary) hypertension: Secondary | ICD-10-CM | POA: Insufficient documentation

## 2022-03-17 DIAGNOSIS — E785 Hyperlipidemia, unspecified: Secondary | ICD-10-CM | POA: Diagnosis not present

## 2022-03-17 LAB — CMP (CANCER CENTER ONLY)
ALT: 11 U/L (ref 0–44)
AST: 15 U/L (ref 15–41)
Albumin: 4.2 g/dL (ref 3.5–5.0)
Alkaline Phosphatase: 62 U/L (ref 38–126)
Anion gap: 11 (ref 5–15)
BUN: 18 mg/dL (ref 8–23)
CO2: 24 mmol/L (ref 22–32)
Calcium: 9.4 mg/dL (ref 8.9–10.3)
Chloride: 104 mmol/L (ref 98–111)
Creatinine: 1 mg/dL (ref 0.61–1.24)
GFR, Estimated: >60 mL/min (ref >60)
Glucose, Bld: 131 mg/dL — ABNORMAL HIGH (ref 70–99)
Potassium: 3.8 mmol/L (ref 3.5–5.1)
Sodium: 139 mmol/L (ref 135–145)
Total Bilirubin: 0.4 mg/dL (ref 0.3–1.2)
Total Protein: 6.7 g/dL (ref 6.5–8.1)

## 2022-03-17 LAB — CBC WITH DIFFERENTIAL (CANCER CENTER ONLY)
Abs Immature Granulocytes: 0.12 10*3/uL — ABNORMAL HIGH (ref 0.00–0.07)
Basophils Absolute: 0.1 10*3/uL (ref 0.0–0.1)
Basophils Relative: 1 %
Eosinophils Absolute: 0.3 10*3/uL (ref 0.0–0.5)
Eosinophils Relative: 4 %
HCT: 38.5 % — ABNORMAL LOW (ref 39.0–52.0)
Hemoglobin: 13.7 g/dL (ref 13.0–17.0)
Immature Granulocytes: 1 %
Lymphocytes Relative: 22 %
Lymphs Abs: 1.9 10*3/uL (ref 0.7–4.0)
MCH: 35.9 pg — ABNORMAL HIGH (ref 26.0–34.0)
MCHC: 35.6 g/dL (ref 30.0–36.0)
MCV: 100.8 fL — ABNORMAL HIGH (ref 80.0–100.0)
Monocytes Absolute: 0.7 10*3/uL (ref 0.1–1.0)
Monocytes Relative: 8 %
Neutro Abs: 5.5 10*3/uL (ref 1.7–7.7)
Neutrophils Relative %: 64 %
Platelet Count: 251 10*3/uL (ref 150–400)
RBC: 3.82 MIL/uL — ABNORMAL LOW (ref 4.22–5.81)
RDW: 17.9 % — ABNORMAL HIGH (ref 11.5–15.5)
WBC Count: 8.6 10*3/uL (ref 4.0–10.5)
nRBC: 0 % (ref 0.0–0.2)

## 2022-03-17 NOTE — Progress Notes (Signed)
  [place] OFFICE PROGRESS NOTE   Diagnosis: Colon cancer  INTERVAL HISTORY:   [name] returns as scheduled.  He completed another cycle of Xeloda beginning 02/27/2022.  No mouth sores, nausea, or hand/foot pain.  He is using Voltaren gel on the hands and feet.  He has not noticed a difference in the skin.  He has intermittent loose stool, but no frank diarrhea. No chest pain or dyspnea.  Good appetite.  He received a COVID-vaccine yesterday. Objective:  Vital signs in last 24 hours:  Blood pressure 136/72, pulse 66, temperature 98.2 F (36.8 C), temperature source Oral, resp. rate 18, height _0  (1.676 m), weight 221 lb (100.2 kg), SpO2 98 %.    HEENT: No thrush or ulcers Resp: Lungs clear bilaterally Cardio: Regular rate and rhythm, distant heart sounds GI: No hepatosplenomegaly Vascular: No leg edema  Skin: Palms without erythema or skin breakdown, skin thickening and hyperpigmentation at the soles, no blisters or skin breakdown   Lab Results:  Lab Results  Component Value Date   WBC 8.6 03/17/2022   HGB 13.7 03/17/2022   HCT 38.5 (L) 03/17/2022   MCV 100.8 (H) 03/17/2022   PLT 251 03/17/2022   NEUTROABS 5.5 03/17/2022    CMP  Lab Results  Component Value Date   NA 138 02/23/2022   K 4.3 02/23/2022   CL 104 02/23/2022   CO2 28 02/23/2022   GLUCOSE 127 (H) 02/23/2022   BUN 12 02/23/2022   CREATININE 1.09 02/23/2022   CALCIUM 9.9 02/23/2022   PROT 6.6 02/23/2022   ALBUMIN 4.3 02/23/2022   AST 19 02/23/2022   ALT 14 02/23/2022   ALKPHOS 43 02/23/2022   BILITOT 0.5 02/23/2022   GFRNONAA >60 02/23/2022   GFRAA  12/11/2007    >60        The eGFR has been calculated using the MDRD equation. This calculation has not been validated in all clinical    Lab Results  Component Value Date   CEA 3.96 01/06/2022  iewed the patient's current medications.   Assessment/Plan:  Colon cancer, transverse, stage IIIa (T3N1), status post an  extended right hemicolectomy 09/16/2021 Transverse colon mass noted on colonoscopy 3/29/202-invasive moderately differentiated adenocarcinoma, no loss of mismatch repair protein expression CTs 08/08/2021-left low axillary chest wall nodule measuring 1.1 cm, postoperative findings in the left colon, complex ventral hernia Transverse colon cancer 09/16/2021-MSS, no loss of mismatch repair protein expression, lymphovascular invasion present, no tumor deposits Cycle 1 Xeloda 11/07/2021 Cycle 2 Xeloda 11/28/2021 Cycle 3 Xeloda 12/19/2021 Cycle 4 Xeloda 01/09/2022 Cycle 5 held due to progressive hand-foot syndrome Cycle 5 Xeloda 02/06/2022, Xeloda dose adjusted to 1500 mg twice daily for 14 days followed by 7-day break, Voltaren gel initiated-he did not begin Voltaren gel 02/23/2022 Cycle 6 Xeloda 02/27/2022 Cycle 7 Xeloda 03/20/2022 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 difficile, 10-day course of vancomycin completed   Disposition: [name] appears stable.  He is tolerating Xeloda well.  He has some mild changes of hand/foot syndrome.  He will complete another cycle of Xeloda beginning 03/20/2022.  He will return for an office and lab visit in 3 weeks.  Betsy Coder, MD  03/17/2022  8:45 AM

## 2022-03-29 ENCOUNTER — Other Ambulatory Visit (HOSPITAL_COMMUNITY): Payer: Self-pay

## 2022-04-03 ENCOUNTER — Other Ambulatory Visit (HOSPITAL_COMMUNITY): Payer: Self-pay

## 2022-04-03 ENCOUNTER — Telehealth: Payer: Self-pay | Admitting: *Deleted

## 2022-04-03 NOTE — Telephone Encounter (Signed)
Called to request flu vaccine at his 11/30 office visit. Informed him that we can do that for him.

## 2022-04-04 ENCOUNTER — Other Ambulatory Visit (HOSPITAL_COMMUNITY): Payer: Self-pay

## 2022-04-04 ENCOUNTER — Other Ambulatory Visit: Payer: Self-pay | Admitting: Oncology

## 2022-04-04 DIAGNOSIS — C184 Malignant neoplasm of transverse colon: Secondary | ICD-10-CM

## 2022-04-05 ENCOUNTER — Other Ambulatory Visit (HOSPITAL_COMMUNITY): Payer: Self-pay

## 2022-04-05 MED ORDER — CAPECITABINE 500 MG PO TABS
1500.0000 mg | ORAL_TABLET | Freq: Two times a day (BID) | ORAL | 0 refills | Status: DC
  Filled 2022-04-05: qty 70, 12d supply, fill #0

## 2022-04-06 ENCOUNTER — Inpatient Hospital Stay: Payer: Medicare Other | Admitting: Nurse Practitioner

## 2022-04-06 ENCOUNTER — Encounter: Payer: Self-pay | Admitting: Nurse Practitioner

## 2022-04-06 ENCOUNTER — Inpatient Hospital Stay: Payer: Medicare Other

## 2022-04-06 ENCOUNTER — Other Ambulatory Visit (HOSPITAL_COMMUNITY): Payer: Self-pay

## 2022-04-06 VITALS — BP 133/66 | HR 60 | Temp 98.2°F | Resp 20 | Ht 66.0 in | Wt 219.2 lb

## 2022-04-06 DIAGNOSIS — C184 Malignant neoplasm of transverse colon: Secondary | ICD-10-CM

## 2022-04-06 DIAGNOSIS — L271 Localized skin eruption due to drugs and medicaments taken internally: Secondary | ICD-10-CM | POA: Diagnosis not present

## 2022-04-06 DIAGNOSIS — Z23 Encounter for immunization: Secondary | ICD-10-CM | POA: Diagnosis not present

## 2022-04-06 DIAGNOSIS — I10 Essential (primary) hypertension: Secondary | ICD-10-CM | POA: Diagnosis not present

## 2022-04-06 DIAGNOSIS — E785 Hyperlipidemia, unspecified: Secondary | ICD-10-CM | POA: Diagnosis not present

## 2022-04-06 LAB — CMP (CANCER CENTER ONLY)
ALT: 18 U/L (ref 0–44)
AST: 19 U/L (ref 15–41)
Albumin: 4.3 g/dL (ref 3.5–5.0)
Alkaline Phosphatase: 58 U/L (ref 38–126)
Anion gap: 6 (ref 5–15)
BUN: 13 mg/dL (ref 8–23)
CO2: 30 mmol/L (ref 22–32)
Calcium: 9.8 mg/dL (ref 8.9–10.3)
Chloride: 104 mmol/L (ref 98–111)
Creatinine: 1.07 mg/dL (ref 0.61–1.24)
GFR, Estimated: >60 mL/min (ref >60)
Glucose, Bld: 168 mg/dL — ABNORMAL HIGH (ref 70–99)
Potassium: 4.1 mmol/L (ref 3.5–5.1)
Sodium: 140 mmol/L (ref 135–145)
Total Bilirubin: 0.5 mg/dL (ref 0.3–1.2)
Total Protein: 6.8 g/dL (ref 6.5–8.1)

## 2022-04-06 LAB — CBC WITH DIFFERENTIAL (CANCER CENTER ONLY)
Abs Immature Granulocytes: 0.05 10*3/uL (ref 0.00–0.07)
Basophils Absolute: 0.1 10*3/uL (ref 0.0–0.1)
Basophils Relative: 1 %
Eosinophils Absolute: 0.3 10*3/uL (ref 0.0–0.5)
Eosinophils Relative: 4 %
HCT: 41.6 % (ref 39.0–52.0)
Hemoglobin: 14.6 g/dL (ref 13.0–17.0)
Immature Granulocytes: 1 %
Lymphocytes Relative: 24 %
Lymphs Abs: 1.9 10*3/uL (ref 0.7–4.0)
MCH: 35.4 pg — ABNORMAL HIGH (ref 26.0–34.0)
MCHC: 35.1 g/dL (ref 30.0–36.0)
MCV: 101 fL — ABNORMAL HIGH (ref 80.0–100.0)
Monocytes Absolute: 0.7 10*3/uL (ref 0.1–1.0)
Monocytes Relative: 8 %
Neutro Abs: 4.9 10*3/uL (ref 1.7–7.7)
Neutrophils Relative %: 62 %
Platelet Count: 222 10*3/uL (ref 150–400)
RBC: 4.12 MIL/uL — ABNORMAL LOW (ref 4.22–5.81)
RDW: 17.4 % — ABNORMAL HIGH (ref 11.5–15.5)
WBC Count: 7.9 10*3/uL (ref 4.0–10.5)
nRBC: 0 % (ref 0.0–0.2)

## 2022-04-06 LAB — CEA (ACCESS): CEA (CHCC): 3.21 ng/mL (ref 0.00–5.00)

## 2022-04-06 MED ORDER — INFLUENZA VAC A&B SA ADJ QUAD 0.5 ML IM PRSY
0.5000 mL | PREFILLED_SYRINGE | Freq: Once | INTRAMUSCULAR | Status: AC
  Administered 2022-04-06: 0.5 mL via INTRAMUSCULAR
  Filled 2022-04-06: qty 0.5

## 2022-04-06 NOTE — Progress Notes (Signed)
  [place] OFFICE PROGRESS NOTE   Diagnosis: Colon cancer  INTERVAL HISTORY:   Mr. Beville returns as scheduled.  He completed cycle 7 Xeloda beginning 03/20/2022.  He denies nausea/vomiting.  No mouth sores.  No diarrhea.  Stable hand and foot symptoms.  Objective:  Vital signs in last 24 hours:  Blood pressure 133/66, pulse 60, temperature 98.2 F (36.8 C), temperature source Oral, resp. rate 20, height _0  (1.676 m), weight 219 lb 3.2 oz (99.4 kg), SpO2 100 %.    HEENT: No thrush or ulcers. Resp: Lungs clear bilaterally. Cardio: Regular rate and rhythm. GI: No hepatosplenomegaly. Vascular: No leg edema.  Skin: Palms with skin thickening, no erythema or skin breakdown.  Soles with skin thickening and hyperpigmentation.  No erythema or skin breakdown.   Lab Results:  Lab Results  Component Value Date   WBC 7.9 04/06/2022   HGB 14.6 04/06/2022   HCT 41.6 04/06/2022   MCV 101.0 (H) 04/06/2022   PLT 222 04/06/2022   NEUTROABS 4.9 04/06/2022    Imaging:  No results found.  Medications: I have reviewed the patient's current medications.  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/16/2021 Transverse colon mass noted on colonoscopy 3/29/202-invasive moderately differentiated adenocarcinoma, no loss of mismatch repair protein expression CTs 08/08/2021-left low axillary chest wall nodule measuring 1.1 cm, postoperative findings in the left colon, complex ventral hernia Transverse colon cancer 09/16/2021-MSS, no loss of mismatch repair protein expression, lymphovascular invasion present, no tumor deposits Cycle 1 Xeloda 11/07/2021 Cycle 2 Xeloda 11/28/2021 Cycle 3 Xeloda 12/19/2021 Cycle 4 Xeloda 01/09/2022 Cycle 5 held due to progressive hand-foot syndrome Cycle 5 Xeloda 02/06/2022, Xeloda dose adjusted to 1500 mg twice daily for 14 days followed by 7-day break, Voltaren gel initiated-he did not  begin Voltaren gel 02/23/2022 Cycle 6 Xeloda 02/27/2022 Cycle 7 Xeloda 03/20/2022 Cycle 8 Xeloda 04/10/2022 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 difficile, 10-day course of vancomycin completed  Disposition: Mr. Dimaio appears stable.  He has completed 7 cycles of adjuvant Xeloda.  He will begin the eighth and final cycle as scheduled on 04/10/2022.  CBC reviewed.  Counts adequate to begin Xeloda as above.  Stable mild changes of hand-foot syndrome.  He will receive the influenza vaccine today.  He will return for follow-up in 1 month.  We are available to see him sooner if needed.  Ned Card ANP/GNP-BC   04/06/2022  8:50 AM

## 2022-04-06 NOTE — Patient Instructions (Signed)

## 2022-04-11 ENCOUNTER — Encounter: Payer: Self-pay | Admitting: Podiatry

## 2022-04-11 ENCOUNTER — Ambulatory Visit: Payer: Medicare Other | Admitting: Podiatry

## 2022-04-11 VITALS — BP 160/77

## 2022-04-11 DIAGNOSIS — B351 Tinea unguium: Secondary | ICD-10-CM | POA: Diagnosis not present

## 2022-04-11 DIAGNOSIS — M79674 Pain in right toe(s): Secondary | ICD-10-CM

## 2022-04-11 DIAGNOSIS — M79675 Pain in left toe(s): Secondary | ICD-10-CM

## 2022-04-11 NOTE — Progress Notes (Signed)
This patient presents to the office with chief complaint of long thick painful nails.  Patient says the nails are painful walking and wearing shoes.  This patient is unable to self treat.  This patient is unable to trim his nails since he is unable to reach his nails.     He presents to the office for preventative foot care services.  General Appearance  Alert, conversant and in no acute stress.  Vascular  Dorsalis pedis and posterior tibial  pulses are palpable  bilaterally.  Capillary return is within normal limits  bilaterally. Temperature is within normal limits  bilaterally.  Neurologic  Senn-Weinstein monofilament wire test within normal limits  bilaterally. Muscle power within normal limits bilaterally.  Nails Thick disfigured discolored nails with subungual debris  from hallux to fifth toes bilaterally. No evidence of bacterial infection or drainage bilaterally.  Orthopedic  No limitations of motion  feet .  No crepitus or effusions noted.  No bony pathology or digital deformities noted.  Skin  normotropic skin with no porokeratosis noted bilaterally.  No signs of infections or ulcers noted.     Onychomycosis  Nails  B/L.  Pain in right toes  Pain in left toes  Debridement of nails both feet followed trimming the nails with dremel    RTC 3 months    Gardiner Barefoot DPM

## 2022-04-24 ENCOUNTER — Other Ambulatory Visit (HOSPITAL_COMMUNITY): Payer: Self-pay

## 2022-05-11 ENCOUNTER — Inpatient Hospital Stay: Payer: Medicare Other | Admitting: Oncology

## 2022-05-11 ENCOUNTER — Inpatient Hospital Stay: Payer: Medicare Other | Attending: Oncology

## 2022-05-11 VITALS — BP 136/76 | HR 53 | Temp 98.2°F | Resp 18 | Ht 66.0 in | Wt 223.0 lb

## 2022-05-11 DIAGNOSIS — C184 Malignant neoplasm of transverse colon: Secondary | ICD-10-CM | POA: Insufficient documentation

## 2022-05-11 DIAGNOSIS — I10 Essential (primary) hypertension: Secondary | ICD-10-CM | POA: Insufficient documentation

## 2022-05-11 DIAGNOSIS — E785 Hyperlipidemia, unspecified: Secondary | ICD-10-CM | POA: Diagnosis not present

## 2022-05-11 LAB — CBC WITH DIFFERENTIAL (CANCER CENTER ONLY)
Abs Immature Granulocytes: 0.08 10*3/uL — ABNORMAL HIGH (ref 0.00–0.07)
Basophils Absolute: 0 10*3/uL (ref 0.0–0.1)
Basophils Relative: 1 %
Eosinophils Absolute: 0.2 10*3/uL (ref 0.0–0.5)
Eosinophils Relative: 4 %
HCT: 41.3 % (ref 39.0–52.0)
Hemoglobin: 14.7 g/dL (ref 13.0–17.0)
Immature Granulocytes: 1 %
Lymphocytes Relative: 22 %
Lymphs Abs: 1.5 10*3/uL (ref 0.7–4.0)
MCH: 35.4 pg — ABNORMAL HIGH (ref 26.0–34.0)
MCHC: 35.6 g/dL (ref 30.0–36.0)
MCV: 99.5 fL (ref 80.0–100.0)
Monocytes Absolute: 0.7 10*3/uL (ref 0.1–1.0)
Monocytes Relative: 10 %
Neutro Abs: 4.4 10*3/uL (ref 1.7–7.7)
Neutrophils Relative %: 62 %
Platelet Count: 207 10*3/uL (ref 150–400)
RBC: 4.15 MIL/uL — ABNORMAL LOW (ref 4.22–5.81)
RDW: 16.4 % — ABNORMAL HIGH (ref 11.5–15.5)
WBC Count: 6.9 10*3/uL (ref 4.0–10.5)
nRBC: 0 % (ref 0.0–0.2)

## 2022-05-11 LAB — CMP (CANCER CENTER ONLY)
ALT: 13 U/L (ref 0–44)
AST: 17 U/L (ref 15–41)
Albumin: 4.1 g/dL (ref 3.5–5.0)
Alkaline Phosphatase: 56 U/L (ref 38–126)
Anion gap: 9 (ref 5–15)
BUN: 10 mg/dL (ref 8–23)
CO2: 29 mmol/L (ref 22–32)
Calcium: 9.6 mg/dL (ref 8.9–10.3)
Chloride: 103 mmol/L (ref 98–111)
Creatinine: 1 mg/dL (ref 0.61–1.24)
GFR, Estimated: >60 mL/min (ref >60)
Glucose, Bld: 123 mg/dL — ABNORMAL HIGH (ref 70–99)
Potassium: 3.9 mmol/L (ref 3.5–5.1)
Sodium: 141 mmol/L (ref 135–145)
Total Bilirubin: 0.7 mg/dL (ref 0.3–1.2)
Total Protein: 6.6 g/dL (ref 6.5–8.1)

## 2022-05-11 NOTE — Progress Notes (Signed)
[place] OFFICE PROGRESS NOTE   Diagnosis: Colon cancer  INTERVAL HISTORY:   Louis [name] completed a final cycle of Xeloda beginning 04/10/2022.  He reports improvement in the hand/foot symptoms.  He has a painful skin lesion at the upper back.  He has soreness at the low abdomen when pressing against a countertop.  Objective:  Vital signs in last 24 hours:  Blood pressure 136/76, pulse (!) 53, temperature 98.2 F (36.8 C), temperature source Oral, resp. rate 18, height _0  (1.676 m), weight 223 lb (101.2 kg), SpO2 98 %.    HEENT: No thrush or ulcers Lymphatics: No cervical, supraclavicular, axillary, or inguinal nodes Resp: Lungs clear bilaterally Cardio: Regular rate and rhythm, distant heart sounds GI: No hepatosplenomegaly, no mass, nontender Vascular: No leg edema  Skin: Inflamed sebaceous cyst at the mid upper back, mild erythema and superficial desquamation at the hands, callus formation and hyperpigmentation of the soles   Lab Results:  Lab Results  Component Value Date   WBC 6.9 05/11/2022   HGB 14.7 05/11/2022   HCT 41.3 05/11/2022   MCV 99.5 05/11/2022   PLT 207 05/11/2022   NEUTROABS 4.4 05/11/2022    CMP  Lab Results  Component Value Date   NA 141 05/11/2022   K 3.9 05/11/2022   CL 103 05/11/2022   CO2 29 05/11/2022   GLUCOSE 123 (H) 05/11/2022   BUN 10 05/11/2022   CREATININE 1.00 05/11/2022   CALCIUM 9.6 05/11/2022   PROT 6.6 05/11/2022   ALBUMIN 4.1 05/11/2022   AST 17 05/11/2022   ALT 13 05/11/2022   ALKPHOS 56 05/11/2022   BILITOT 0.7 05/11/2022   GFRNONAA >60 05/11/2022   GFRAA  12/11/2007    >60        The eGFR has been calculated using the MDRD equation. This calculation has not been validated in all clinical    Lab Results  Component Value Date   CEA 3.21 04/06/2022     Medications: I have reviewed the patient's current medications.   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/16/2021 Transverse colon mass noted on colonoscopy 3/29/202-invasive moderately differentiated adenocarcinoma, no loss of mismatch repair protein expression CTs 08/08/2021-left low axillary chest wall nodule measuring 1.1 cm, postoperative findings in the left colon, complex ventral hernia Transverse colon cancer 09/16/2021-MSS, no loss of mismatch repair protein expression, lymphovascular invasion present, no tumor deposits Cycle 1 Xeloda 11/07/2021 Cycle 2 Xeloda 11/28/2021 Cycle 3 Xeloda 12/19/2021 Cycle 4 Xeloda 01/09/2022 Cycle 5 held due to progressive hand-foot syndrome Cycle 5 Xeloda 02/06/2022, Xeloda dose adjusted to 1500 mg twice daily for 14 days followed by 7-day break, Voltaren gel initiated-he did not begin Voltaren gel 02/23/2022 Cycle 6 Xeloda 02/27/2022 Cycle 7 Xeloda 03/20/2022 Cycle 8 Xeloda 04/10/2022 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 difficile, 10-day course of vancomycin completed    Disposition: [name] has completed the course of adjuvant Xeloda.  He is in clinical remission from colon cancer.  He will be scheduled for an office visit and surveillance imaging in 4 months.  He will be referred to gastroenterology for a surveillance colonoscopy.  I recommended he follow-up with Dr. Brigitte Pulse or his dermatologist for treatment of the sebaceous cyst.  The hand/foot skin  changes should improve over the next few weeks.  Betsy Coder, MD  05/11/2022  11:01 AM

## 2022-05-15 ENCOUNTER — Encounter: Payer: Self-pay | Admitting: *Deleted

## 2022-05-15 DIAGNOSIS — H401122 Primary open-angle glaucoma, left eye, moderate stage: Secondary | ICD-10-CM | POA: Diagnosis not present

## 2022-05-15 DIAGNOSIS — H04123 Dry eye syndrome of bilateral lacrimal glands: Secondary | ICD-10-CM | POA: Diagnosis not present

## 2022-05-15 DIAGNOSIS — E119 Type 2 diabetes mellitus without complications: Secondary | ICD-10-CM | POA: Diagnosis not present

## 2022-05-15 DIAGNOSIS — H40021 Open angle with borderline findings, high risk, right eye: Secondary | ICD-10-CM | POA: Diagnosis not present

## 2022-05-15 DIAGNOSIS — E1169 Type 2 diabetes mellitus with other specified complication: Secondary | ICD-10-CM | POA: Diagnosis not present

## 2022-05-15 NOTE — Progress Notes (Signed)
Faxed referral order, demographics, recent office note, and med list to [facility] for referral to Dr. Simeon Craft . Due colonoscopy in May or June 2024

## 2022-05-22 DIAGNOSIS — Z85828 Personal history of other malignant neoplasm of skin: Secondary | ICD-10-CM | POA: Diagnosis not present

## 2022-05-22 DIAGNOSIS — L821 Other seborrheic keratosis: Secondary | ICD-10-CM | POA: Diagnosis not present

## 2022-05-22 DIAGNOSIS — L814 Other melanin hyperpigmentation: Secondary | ICD-10-CM | POA: Diagnosis not present

## 2022-05-22 DIAGNOSIS — L02212 Cutaneous abscess of back [any part, except buttock]: Secondary | ICD-10-CM | POA: Diagnosis not present

## 2022-05-22 DIAGNOSIS — D225 Melanocytic nevi of trunk: Secondary | ICD-10-CM | POA: Diagnosis not present

## 2022-05-22 DIAGNOSIS — Z08 Encounter for follow-up examination after completed treatment for malignant neoplasm: Secondary | ICD-10-CM | POA: Diagnosis not present

## 2022-05-26 ENCOUNTER — Telehealth: Payer: Self-pay | Admitting: *Deleted

## 2022-05-26 NOTE — Telephone Encounter (Signed)
Received letter from [facility] that they have left message for patient with no return call.  Called [name] and he reports he will call them.

## 2022-06-14 DIAGNOSIS — S0502XA Injury of conjunctiva and corneal abrasion without foreign body, left eye, initial encounter: Secondary | ICD-10-CM | POA: Diagnosis not present

## 2022-06-15 DIAGNOSIS — Z961 Presence of intraocular lens: Secondary | ICD-10-CM | POA: Diagnosis not present

## 2022-06-21 IMAGING — CT CT ABD-PELV W/ CM
2 of 3 series · 11 of 32 positions shown, 15 images · IV contrast (APPLIED)
Comparison: Multiple exams, including 04/11/2017 and 05/21/2009

CLINICAL DATA: Colon cancer, status post resection.

* Tracking Code: BO *
EXAM:
CT CHEST, ABDOMEN, AND PELVIS WITH CONTRAST
TECHNIQUE: Multidetector CT imaging of the chest, abdomen and pelvis was
performed following the standard protocol during bolus
administration of intravenous contrast.

[Series 2: chest/abd/pelvis w/cm · axial · 1.27mm/px · z∈[+674,+1194]mm · 8 of 126 slices shown]
[im 11/126  soft-tissue]
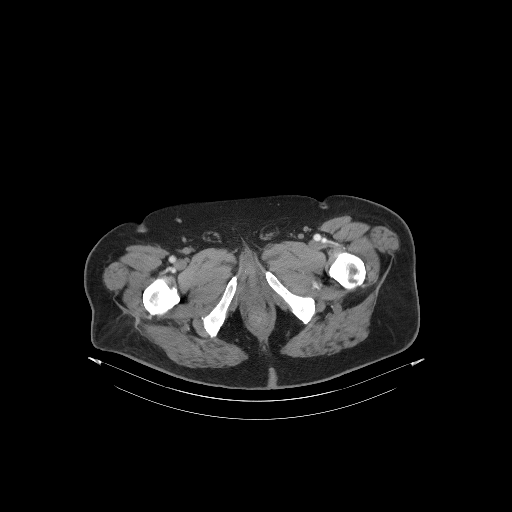
[im 32/126  soft-tissue]
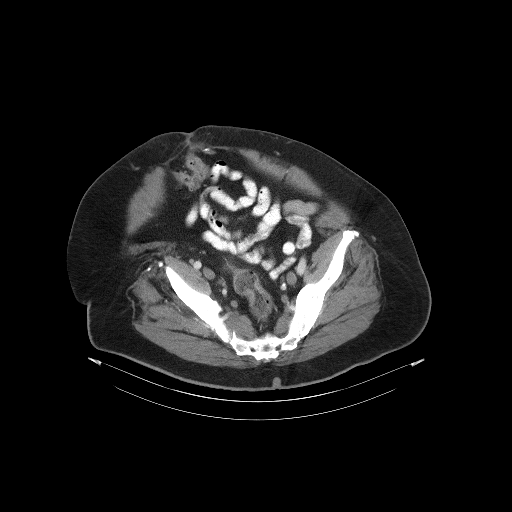
[im 42/126  soft-tissue]
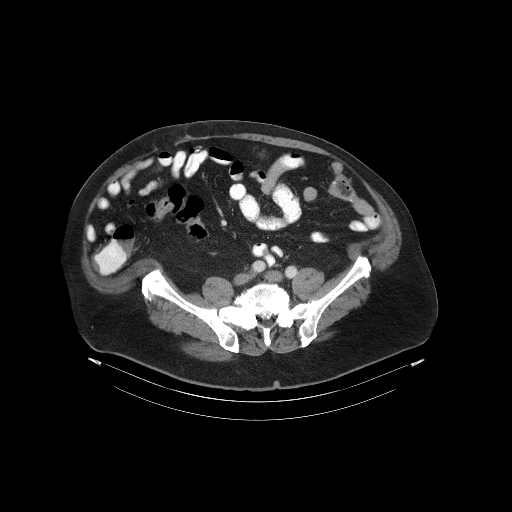
[im 53/126  soft-tissue]
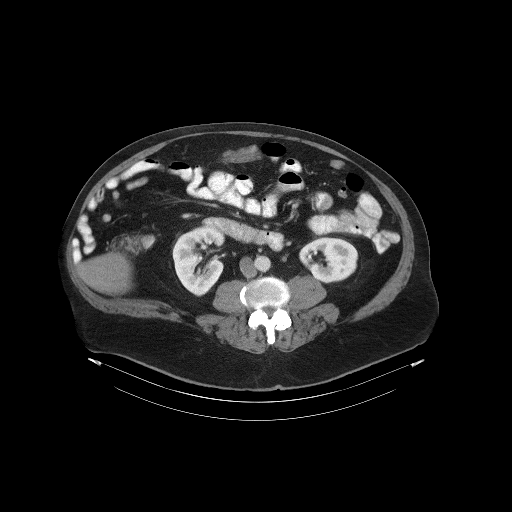
[im 73/126  soft-tissue]
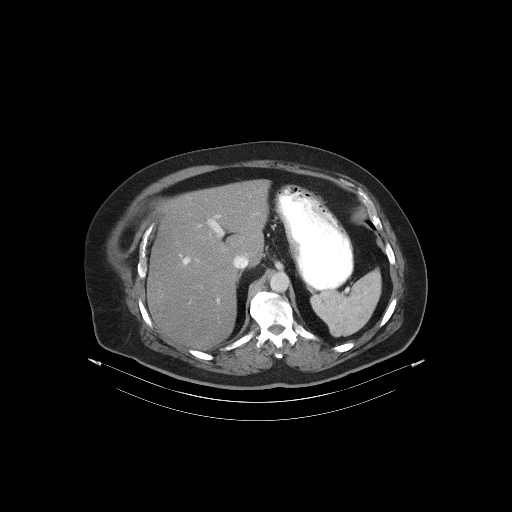
[im 84/126  soft-tissue]
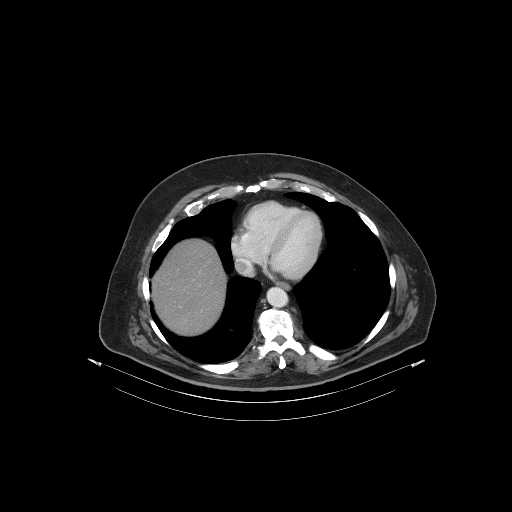
[im 94/126  soft-tissue]
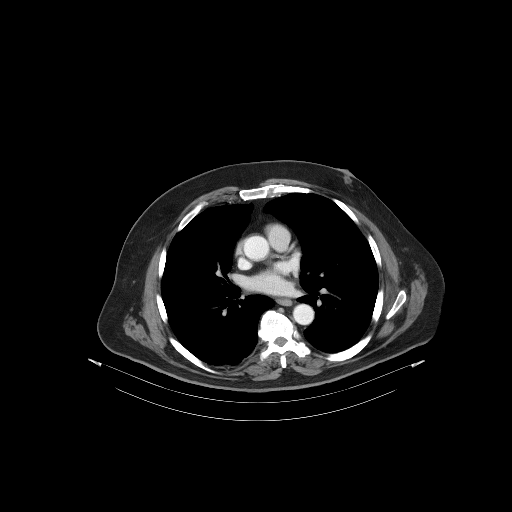
[im 115/126  soft-tissue]
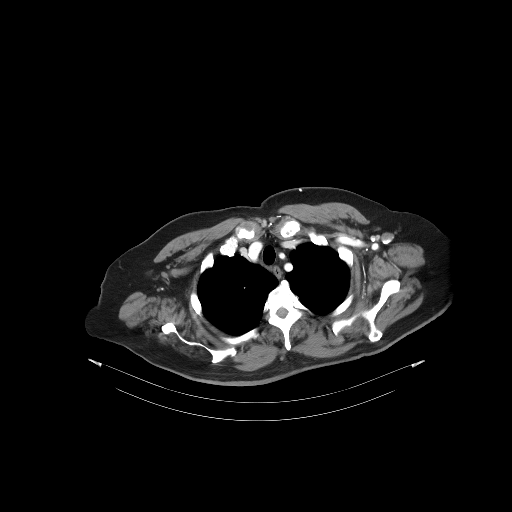

[Series 5: renal delay · axial · delayed · 0.98mm/px · z∈[+825,+970]mm · 3 of 30 slices shown, 7 images]
[im 1/30  soft-tissue]
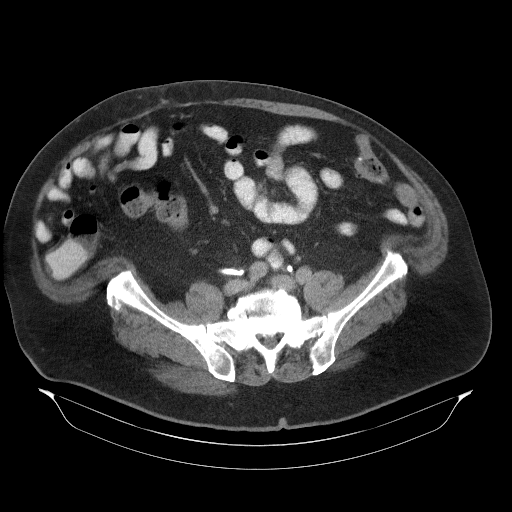
[im 1/30  lung]
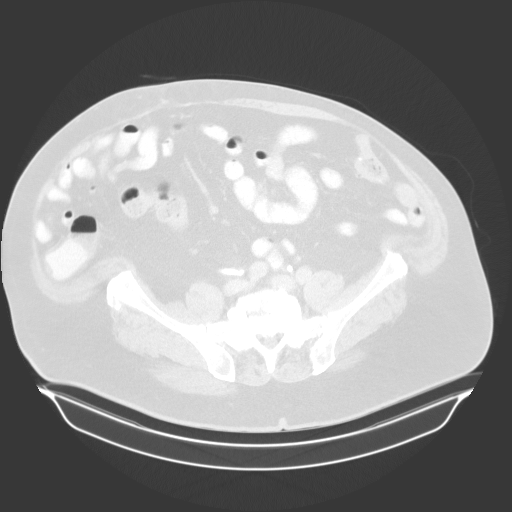
[im 1/30  bone]
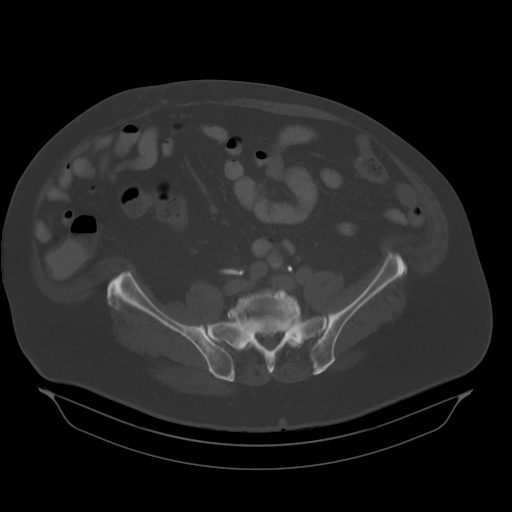
[im 15/30  soft-tissue]
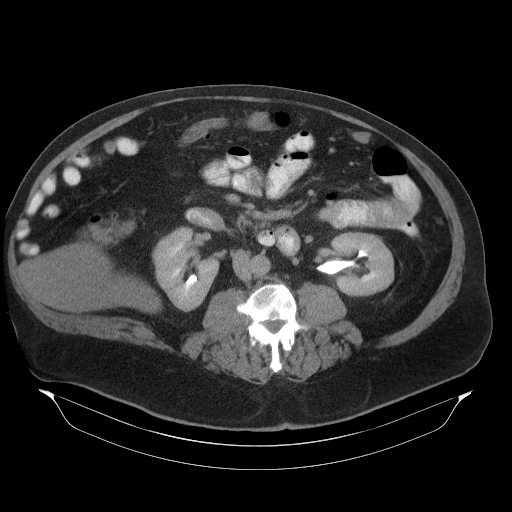
[im 15/30  lung]
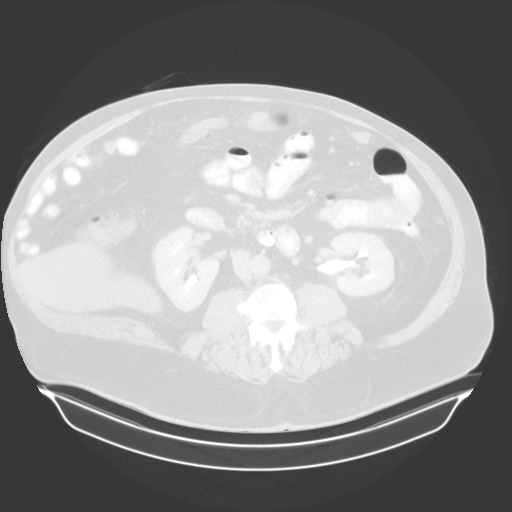
[im 30/30  soft-tissue]
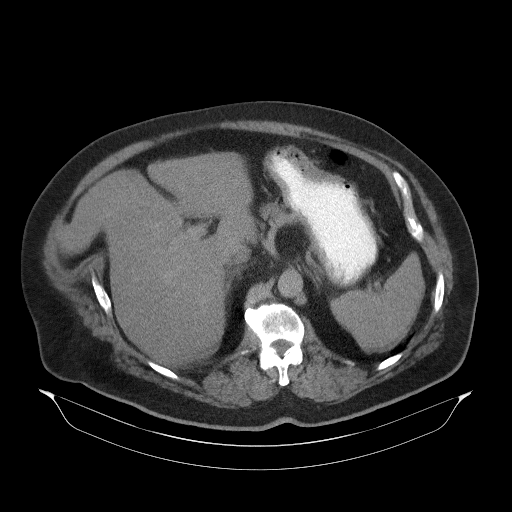
[im 30/30  lung]
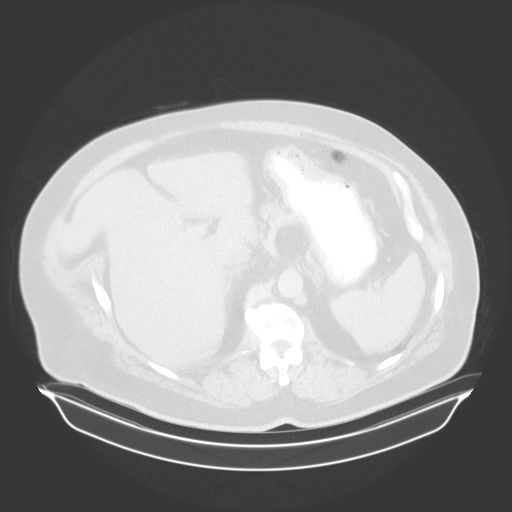

[11 of 32 positions shown; findings below may reference images not displayed]

RADIATION DOSE REDUCTION: This exam was performed according to the
departmental dose-optimization program which includes automated
exposure control, adjustment of the mA and/or kV according to
patient size and/or use of iterative reconstruction technique.

CONTRAST:  100mL ZFDWLQ-KXX IOPAMIDOL (ZFDWLQ-KXX) INJECTION 61%
FINDINGS: CT CHEST FINDINGS

Cardiovascular: Mild aortic valve calcification. Minimal
atherosclerotic calcification of the brachiocephalic artery. Minimal
atherosclerotic calcification of the descending thoracic aorta.

Mediastinum/Nodes: In the lower left axillary region below the level
of the nipple, a subcutaneous soft tissue density nodule measures
1.1 by 1.1 cm on image 37 series 2.

Lungs/Pleura: 3 by 2 mm right upper lobe nodule, image 62 series 4,
no change from 05/21/2009.

Triangular-shaped 4 mm right middle lobe subpleural nodule on image
78 series 4, no change from 05/21/2009.

Calcified 4 mm right lower lobe subpleural nodule on image 97 series
4, likely a benign calcified granuloma, no change from 05/21/2009.

Triangular 3 mm subpleural nodule in the left upper lobe on image 54
series 4, equivocally present on 05/21/2009.

Chronic mild lingular scarring, no change from 05/21/2009

Musculoskeletal: Substantial bilateral degenerative glenohumeral
arthropathy. Possible loose osteochondral fragment in the right
glenohumeral joint thoracic spondylosis with multilevel bridging
spurring.

CT ABDOMEN PELVIS FINDINGS

Hepatobiliary: Cholecystectomy. Possible hepatic steatosis. No
substantial focal liver lesions.

Pancreas: Unremarkable

Spleen: There are few small scattered hypodense splenic lesions not
changed from 04/11/2017 and also similar back through 9949, likely
hemangiomas or similar benign lesions.

Adrenals/Urinary Tract: Small hypodense renal lesions are
technically too small to characterize although statistically likely
to be benign. In general, such lesions are not felt to warrant
further workup unless there is a specific renal clinical concern.

The adrenal glands appear normal.  Urinary bladder unremarkable.

Stomach/Bowel: Small transverse duodenal diverticulum does not
appear inflamed. Scattered diverticula of the sigmoid colon.
Postoperative findings noted in the left colon. Scattered
diverticula of the transverse colon. There a complex anterior
abdominal wall hernia with small lobulations of adipose tissue, but
also a broader component containing a margin of the transverse colon
for example on image 40 of series 6.

Vascular/Lymphatic: Minimal aortoiliac atherosclerotic vascular
calcification. Portacaval lymph node 1.2 cm in short axis on image
59 series 2, within normal limits for this location. No overt
pathologic adenopathy.

Reproductive: Mild prostatomegaly.

Other: No supplemental non-categorized findings.

Musculoskeletal: As noted above there is a mildly complex
multilobulated ventral hernia containing foci of adipose tissue, and
a small portion of the margin of the transverse colon centrally.
Bridging spurring of the SI joints. Lumbar spondylosis and
degenerative disc disease causing multilevel lumbar impingement. No
compelling findings of osseous metastatic disease.
IMPRESSION: 1. A left lower axillary lymph node or small chest wall nodule on
the left measures 1.1 cm in short axis. This was not readily
apparent on the CT chest from 05/21/2009. This lymph node is mildly
notable and quite possibly palpable given its superficial location
and could be reactive or conceivably neoplastic. This finding is
about 7.5 cm lateral to and just below the level of the nipple with
when the patient is supine.
2. Postoperative findings in the left colon.
3. Mildly complex ventral hernia along the abdomen mostly containing
adipose tissue although a wider portion contains a margin of the
transverse colon.
4. Other imaging findings of potential clinical significance: Aortic
Atherosclerosis ([id number]-6BI.I). Scattered colonic diverticulosis.
Mild aortic valve calcification. Degenerative glenohumeral
arthropathy bilaterally. Possible hepatic steatosis. Splenic lesions
demonstrate long-stability compatible with benign process. Mild
prostatomegaly. Multilevel lumbar impingement. Thoracolumbar
spondylosis.

## 2022-06-21 IMAGING — CT CT CHEST W/ CM
1 of 4 series · 11 of 32 positions shown, 16 images · IV contrast (APPLIED)
Comparison: Multiple exams, including 04/11/2017 and 05/21/2009

CLINICAL DATA: Colon cancer, status post resection.

* Tracking Code: BO *
EXAM:
CT CHEST, ABDOMEN, AND PELVIS WITH CONTRAST
TECHNIQUE: Multidetector CT imaging of the chest, abdomen and pelvis was
performed following the standard protocol during bolus
administration of intravenous contrast.

[Series 8: super d · axial · 0.96mm/px · z∈[+939,+1229]mm · 11 of 413 slices shown, 16 images]
[im 25/413  soft-tissue]
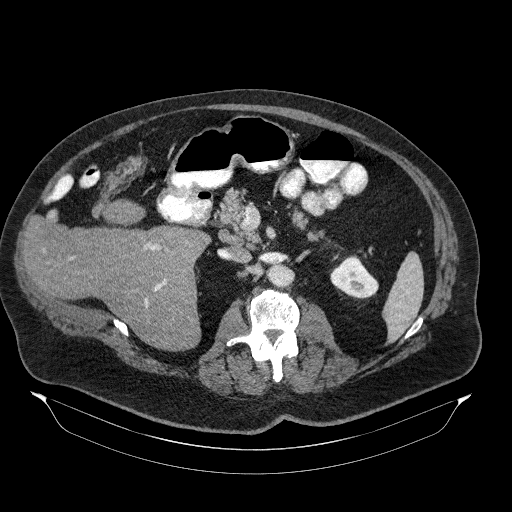
[im 25/413  bone]
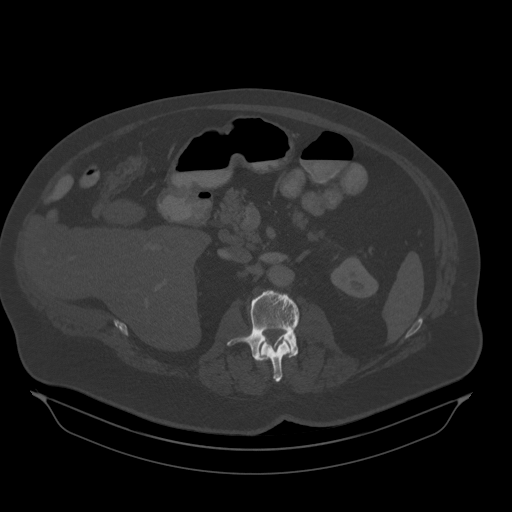
[im 73/413  soft-tissue]
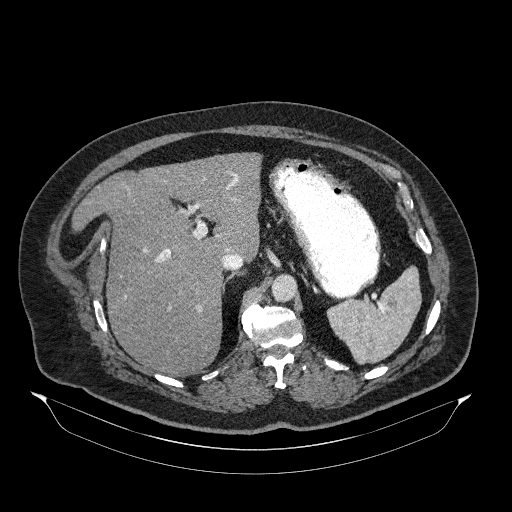
[im 122/413  soft-tissue]
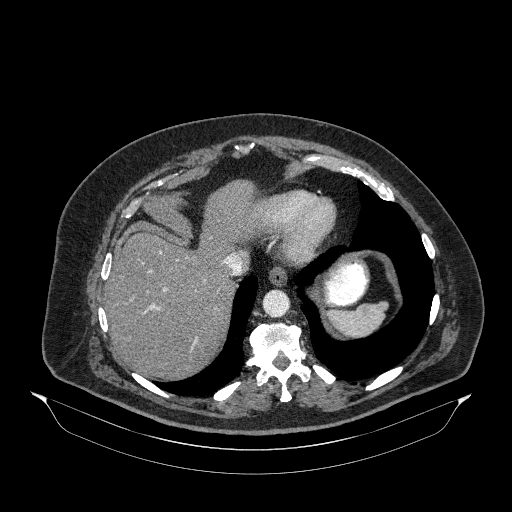
[im 146/413  soft-tissue]
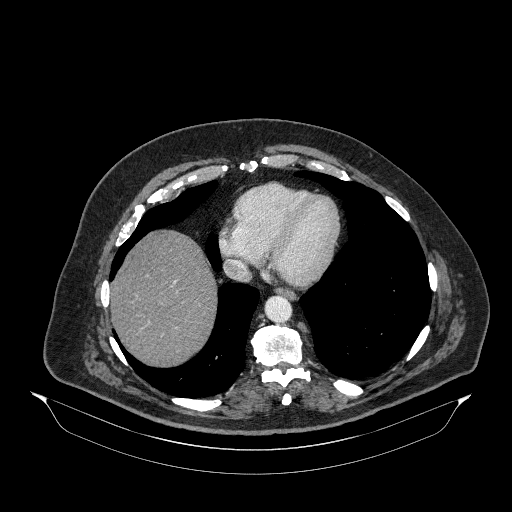
[im 194/413  soft-tissue]
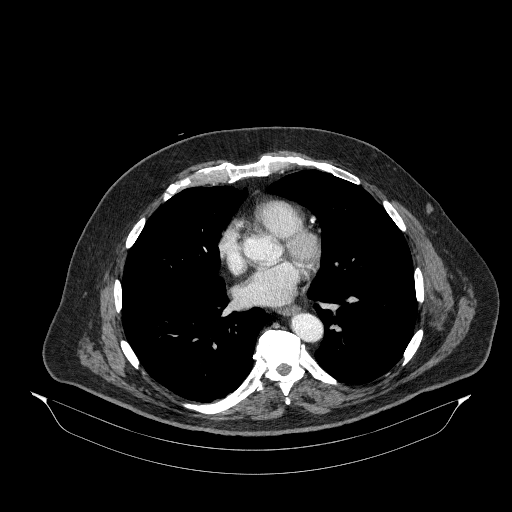
[im 219/413  soft-tissue]
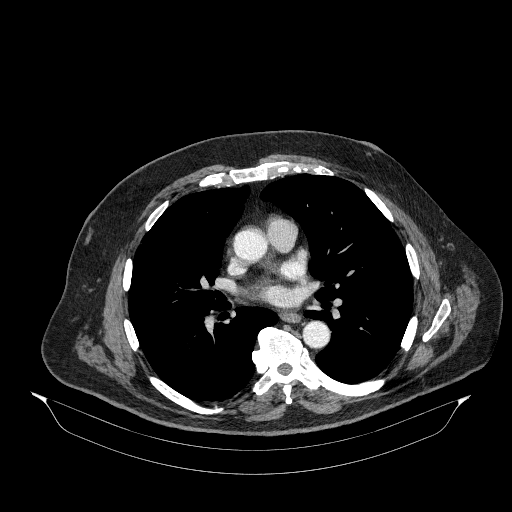
[im 267/413  soft-tissue]
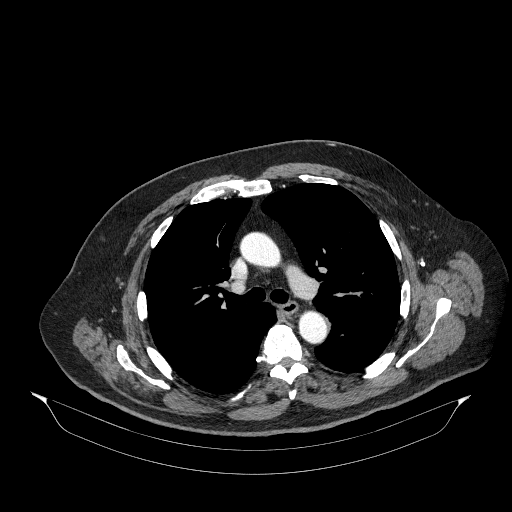
[im 316/413  soft-tissue]
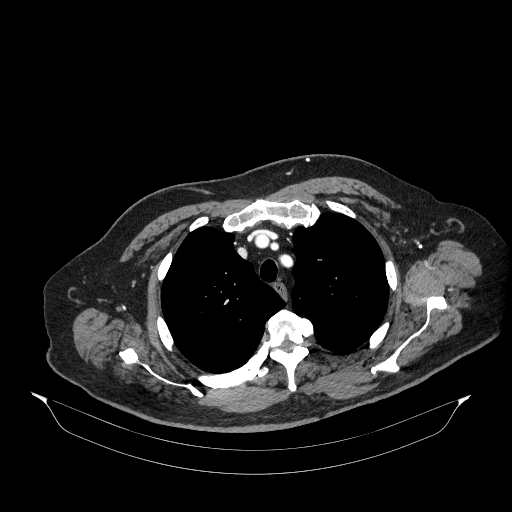
[im 316/413  lung]
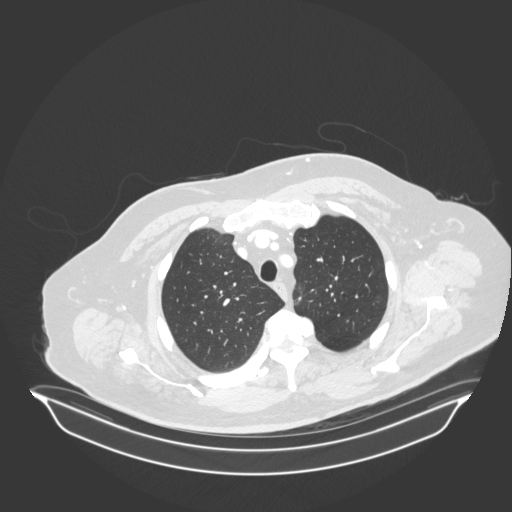
[im 340/413  soft-tissue]
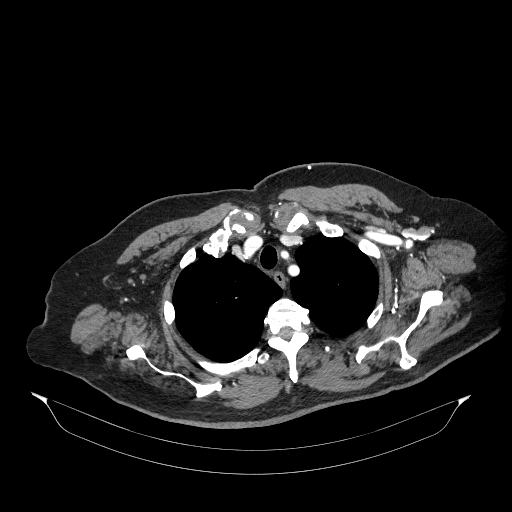
[im 340/413  lung]
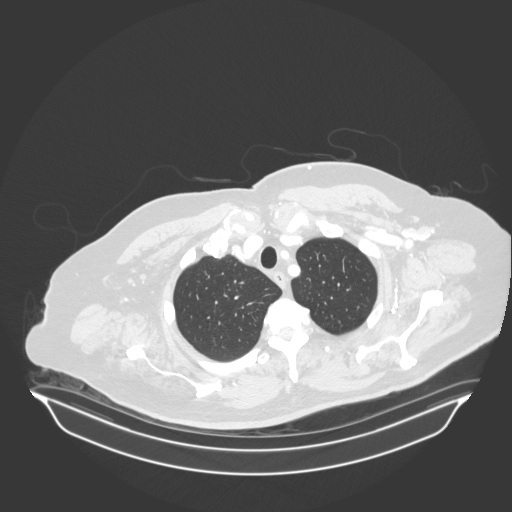
[im 340/413  bone]
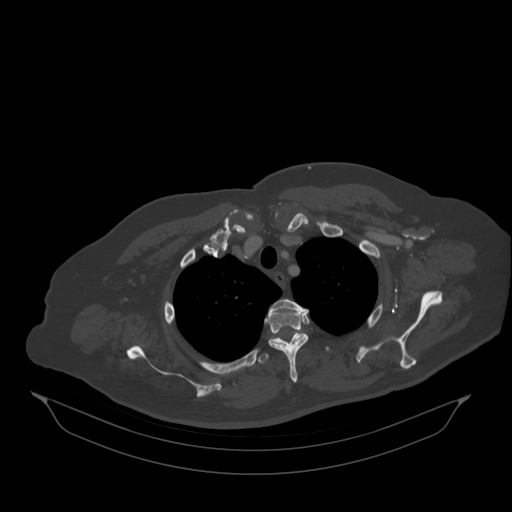
[im 364/413  lung]
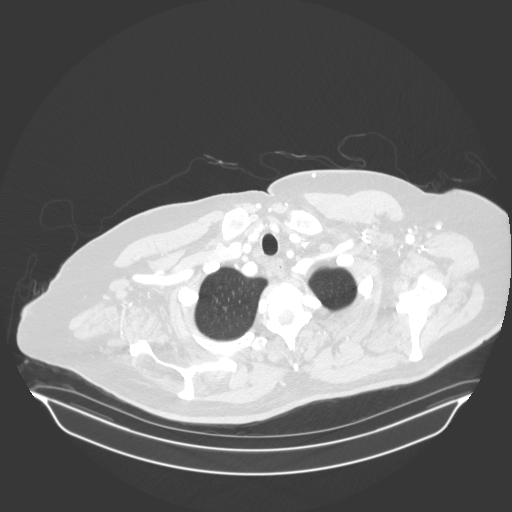
[im 388/413  soft-tissue]
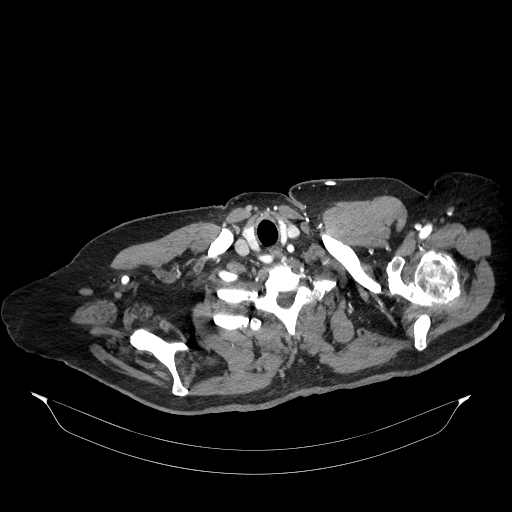
[im 388/413  lung]
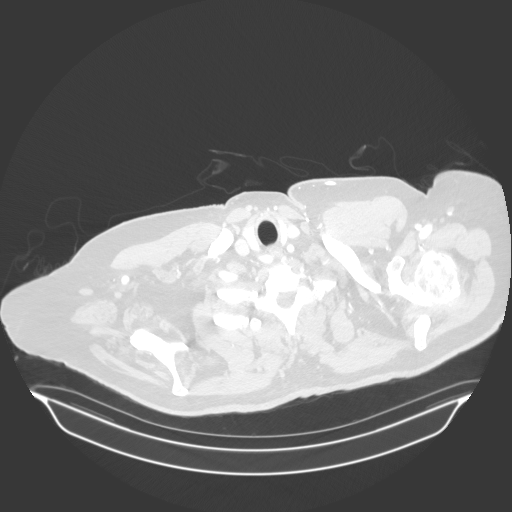

[11 of 32 positions shown; findings below may reference images not displayed]

RADIATION DOSE REDUCTION: This exam was performed according to the
departmental dose-optimization program which includes automated
exposure control, adjustment of the mA and/or kV according to
patient size and/or use of iterative reconstruction technique.

CONTRAST:  100mL ZFDWLQ-KXX IOPAMIDOL (ZFDWLQ-KXX) INJECTION 61%
FINDINGS: CT CHEST FINDINGS

Cardiovascular: Mild aortic valve calcification. Minimal
atherosclerotic calcification of the brachiocephalic artery. Minimal
atherosclerotic calcification of the descending thoracic aorta.

Mediastinum/Nodes: In the lower left axillary region below the level
of the nipple, a subcutaneous soft tissue density nodule measures
1.1 by 1.1 cm on image 37 series 2.

Lungs/Pleura: 3 by 2 mm right upper lobe nodule, image 62 series 4,
no change from 05/21/2009.

Triangular-shaped 4 mm right middle lobe subpleural nodule on image
78 series 4, no change from 05/21/2009.

Calcified 4 mm right lower lobe subpleural nodule on image 97 series
4, likely a benign calcified granuloma, no change from 05/21/2009.

Triangular 3 mm subpleural nodule in the left upper lobe on image 54
series 4, equivocally present on 05/21/2009.

Chronic mild lingular scarring, no change from 05/21/2009

Musculoskeletal: Substantial bilateral degenerative glenohumeral
arthropathy. Possible loose osteochondral fragment in the right
glenohumeral joint thoracic spondylosis with multilevel bridging
spurring.

CT ABDOMEN PELVIS FINDINGS

Hepatobiliary: Cholecystectomy. Possible hepatic steatosis. No
substantial focal liver lesions.

Pancreas: Unremarkable

Spleen: There are few small scattered hypodense splenic lesions not
changed from 04/11/2017 and also similar back through 9949, likely
hemangiomas or similar benign lesions.

Adrenals/Urinary Tract: Small hypodense renal lesions are
technically too small to characterize although statistically likely
to be benign. In general, such lesions are not felt to warrant
further workup unless there is a specific renal clinical concern.

The adrenal glands appear normal.  Urinary bladder unremarkable.

Stomach/Bowel: Small transverse duodenal diverticulum does not
appear inflamed. Scattered diverticula of the sigmoid colon.
Postoperative findings noted in the left colon. Scattered
diverticula of the transverse colon. There a complex anterior
abdominal wall hernia with small lobulations of adipose tissue, but
also a broader component containing a margin of the transverse colon
for example on image 40 of series 6.

Vascular/Lymphatic: Minimal aortoiliac atherosclerotic vascular
calcification. Portacaval lymph node 1.2 cm in short axis on image
59 series 2, within normal limits for this location. No overt
pathologic adenopathy.

Reproductive: Mild prostatomegaly.

Other: No supplemental non-categorized findings.

Musculoskeletal: As noted above there is a mildly complex
multilobulated ventral hernia containing foci of adipose tissue, and
a small portion of the margin of the transverse colon centrally.
Bridging spurring of the SI joints. Lumbar spondylosis and
degenerative disc disease causing multilevel lumbar impingement. No
compelling findings of osseous metastatic disease.
IMPRESSION: 1. A left lower axillary lymph node or small chest wall nodule on
the left measures 1.1 cm in short axis. This was not readily
apparent on the CT chest from 05/21/2009. This lymph node is mildly
notable and quite possibly palpable given its superficial location
and could be reactive or conceivably neoplastic. This finding is
about 7.5 cm lateral to and just below the level of the nipple with
when the patient is supine.
2. Postoperative findings in the left colon.
3. Mildly complex ventral hernia along the abdomen mostly containing
adipose tissue although a wider portion contains a margin of the
transverse colon.
4. Other imaging findings of potential clinical significance: Aortic
Atherosclerosis ([id number]-6BI.I). Scattered colonic diverticulosis.
Mild aortic valve calcification. Degenerative glenohumeral
arthropathy bilaterally. Possible hepatic steatosis. Splenic lesions
demonstrate long-stability compatible with benign process. Mild
prostatomegaly. Multilevel lumbar impingement. Thoracolumbar
spondylosis.

## 2022-06-28 DIAGNOSIS — E119 Type 2 diabetes mellitus without complications: Secondary | ICD-10-CM | POA: Diagnosis not present

## 2022-06-28 DIAGNOSIS — I700 Atherosclerosis of aorta: Secondary | ICD-10-CM | POA: Diagnosis not present

## 2022-06-28 DIAGNOSIS — I10 Essential (primary) hypertension: Secondary | ICD-10-CM | POA: Diagnosis not present

## 2022-06-28 DIAGNOSIS — L409 Psoriasis, unspecified: Secondary | ICD-10-CM | POA: Diagnosis not present

## 2022-06-28 DIAGNOSIS — Z9989 Dependence on other enabling machines and devices: Secondary | ICD-10-CM | POA: Diagnosis not present

## 2022-06-28 DIAGNOSIS — G9529 Other cord compression: Secondary | ICD-10-CM | POA: Diagnosis not present

## 2022-06-28 DIAGNOSIS — J8410 Pulmonary fibrosis, unspecified: Secondary | ICD-10-CM | POA: Diagnosis not present

## 2022-06-28 DIAGNOSIS — E1169 Type 2 diabetes mellitus with other specified complication: Secondary | ICD-10-CM | POA: Diagnosis not present

## 2022-06-28 DIAGNOSIS — E782 Mixed hyperlipidemia: Secondary | ICD-10-CM | POA: Diagnosis not present

## 2022-06-28 DIAGNOSIS — G4733 Obstructive sleep apnea (adult) (pediatric): Secondary | ICD-10-CM | POA: Diagnosis not present

## 2022-06-28 DIAGNOSIS — Z0000 Encounter for general adult medical examination without abnormal findings: Secondary | ICD-10-CM | POA: Diagnosis not present

## 2022-06-28 DIAGNOSIS — C189 Malignant neoplasm of colon, unspecified: Secondary | ICD-10-CM | POA: Diagnosis not present

## 2022-07-18 DIAGNOSIS — C184 Malignant neoplasm of transverse colon: Secondary | ICD-10-CM | POA: Diagnosis not present

## 2022-08-31 DIAGNOSIS — D124 Benign neoplasm of descending colon: Secondary | ICD-10-CM | POA: Diagnosis not present

## 2022-08-31 DIAGNOSIS — K635 Polyp of colon: Secondary | ICD-10-CM | POA: Diagnosis not present

## 2022-08-31 DIAGNOSIS — K529 Noninfective gastroenteritis and colitis, unspecified: Secondary | ICD-10-CM | POA: Diagnosis not present

## 2022-08-31 DIAGNOSIS — Z85038 Personal history of other malignant neoplasm of large intestine: Secondary | ICD-10-CM | POA: Diagnosis not present

## 2022-08-31 DIAGNOSIS — K6289 Other specified diseases of anus and rectum: Secondary | ICD-10-CM | POA: Diagnosis not present

## 2022-08-31 DIAGNOSIS — K648 Other hemorrhoids: Secondary | ICD-10-CM | POA: Diagnosis not present

## 2022-08-31 DIAGNOSIS — Z980 Intestinal bypass and anastomosis status: Secondary | ICD-10-CM | POA: Diagnosis not present

## 2022-09-04 DIAGNOSIS — D124 Benign neoplasm of descending colon: Secondary | ICD-10-CM | POA: Diagnosis not present

## 2022-09-04 DIAGNOSIS — K635 Polyp of colon: Secondary | ICD-10-CM | POA: Diagnosis not present

## 2022-09-05 ENCOUNTER — Ambulatory Visit (HOSPITAL_BASED_OUTPATIENT_CLINIC_OR_DEPARTMENT_OTHER)
Admission: RE | Admit: 2022-09-05 | Discharge: 2022-09-05 | Disposition: A | Discharge status: Home / Self Care | Payer: Medicare Other | Source: Ambulatory Visit | Attending: Oncology | Admitting: Oncology

## 2022-09-05 ENCOUNTER — Inpatient Hospital Stay: Payer: Medicare Other | Attending: Oncology

## 2022-09-05 ENCOUNTER — Other Ambulatory Visit (HOSPITAL_BASED_OUTPATIENT_CLINIC_OR_DEPARTMENT_OTHER): Payer: Medicare Other

## 2022-09-05 DIAGNOSIS — C184 Malignant neoplasm of transverse colon: Secondary | ICD-10-CM

## 2022-09-05 DIAGNOSIS — R911 Solitary pulmonary nodule: Secondary | ICD-10-CM | POA: Insufficient documentation

## 2022-09-05 DIAGNOSIS — K5730 Diverticulosis of large intestine without perforation or abscess without bleeding: Secondary | ICD-10-CM | POA: Insufficient documentation

## 2022-09-05 DIAGNOSIS — C189 Malignant neoplasm of colon, unspecified: Secondary | ICD-10-CM | POA: Diagnosis not present

## 2022-09-05 DIAGNOSIS — K439 Ventral hernia without obstruction or gangrene: Secondary | ICD-10-CM | POA: Diagnosis not present

## 2022-09-05 LAB — BASIC METABOLIC PANEL - CANCER CENTER ONLY
Anion gap: 7 (ref 5–15)
BUN: 15 mg/dL (ref 8–23)
CO2: 29 mmol/L (ref 22–32)
Calcium: 9.7 mg/dL (ref 8.9–10.3)
Chloride: 104 mmol/L (ref 98–111)
Creatinine: 1.05 mg/dL (ref 0.61–1.24)
GFR, Estimated: >60 mL/min (ref >60)
Glucose, Bld: 141 mg/dL — ABNORMAL HIGH (ref 70–99)
Potassium: 4.3 mmol/L (ref 3.5–5.1)
Sodium: 140 mmol/L (ref 135–145)

## 2022-09-05 LAB — CEA (ACCESS): CEA (CHCC): 1.95 ng/mL (ref 0.00–5.00)

## 2022-09-05 MED ORDER — IOHEXOL 300 MG/ML  SOLN
100.0000 mL | Freq: Once | INTRAMUSCULAR | Status: AC | PRN
  Administered 2022-09-05: 85 mL via INTRAVENOUS

## 2022-09-06 ENCOUNTER — Other Ambulatory Visit: Payer: Medicare Other

## 2022-09-08 ENCOUNTER — Inpatient Hospital Stay: Payer: Medicare Other | Attending: Oncology | Admitting: Oncology

## 2022-09-08 VITALS — BP 144/81 | HR 61 | Temp 98.1°F | Resp 18 | Ht 66.0 in | Wt 223.7 lb

## 2022-09-08 DIAGNOSIS — Z8719 Personal history of other diseases of the digestive system: Secondary | ICD-10-CM | POA: Insufficient documentation

## 2022-09-08 DIAGNOSIS — E785 Hyperlipidemia, unspecified: Secondary | ICD-10-CM | POA: Diagnosis not present

## 2022-09-08 DIAGNOSIS — I10 Essential (primary) hypertension: Secondary | ICD-10-CM | POA: Diagnosis not present

## 2022-09-08 DIAGNOSIS — C184 Malignant neoplasm of transverse colon: Secondary | ICD-10-CM | POA: Diagnosis not present

## 2022-09-08 NOTE — Progress Notes (Signed)
[facility] OFFICE PROGRESS NOTE   Diagnosis: Colon cancer  INTERVAL HISTORY:   Mr. [name] is as scheduled.  He generally feels well.  He does not eat as much as he did when he was younger.  He sometimes has urinary urgency.  No bleeding. He underwent a colonoscopy on 08/31/2022.  Polyps were removed from the transverse and descending colon.  The pathology revealed a tubular adenoma and hyperplastic polyp.  Objective:  Vital signs in last 24 hours:  Blood pressure (!) 144/81, pulse 61, temperature 98.1 F (36.7 C), temperature source Oral, resp. rate 18, height 5\' 6"  (1.676 m), weight 223 lb 11.2 oz (101.5 kg), SpO2 100 %.     Lymphatics: No cervical, supraclavicular, axillary, or inguinal nodes Resp: Lungs clear bilaterally Cardio: Regular rate and rhythm GI: Reducible ventral hernia, no hepatosplenomegaly, no mass, nontender Vascular: No leg edema   Lab Results:  Lab Results  Component Value Date   WBC 6.9 05/11/2022   HGB 14.7 05/11/2022   HCT 41.3 05/11/2022   MCV 99.5 05/11/2022   PLT 207 05/11/2022   NEUTROABS 4.4 05/11/2022    CMP  Lab Results  Component Value Date   NA 140 09/05/2022   K 4.3 09/05/2022   CL 104 09/05/2022   CO2 29 09/05/2022   GLUCOSE 141 (H) 09/05/2022   BUN 15 09/05/2022   CREATININE 1.05 09/05/2022   CALCIUM 9.7 09/05/2022   PROT 6.6 05/11/2022   ALBUMIN 4.1 05/11/2022   AST 17 05/11/2022   ALT 13 05/11/2022   ALKPHOS 56 05/11/2022   BILITOT 0.7 05/11/2022   GFRNONAA >60 09/05/2022   GFRAA  12/11/2007    >60        The eGFR has been calculated using the MDRD equation. This calculation has not been validated in all clinical    Lab Results  Component Value Date   CEA 1.95 09/05/2022    No results found for: "INR", "LABPROT"  Imaging:  CT CHEST ABDOMEN PELVIS W CONTRAST  Result Date: 09/06/2022 CLINICAL DATA:  Colon cancer surveillance * Tracking Code: BO * EXAM: CT CHEST, ABDOMEN, AND PELVIS WITH  CONTRAST TECHNIQUE: Multidetector CT imaging of the chest, abdomen and pelvis was performed following the standard protocol during bolus administration of intravenous contrast. RADIATION DOSE REDUCTION: This exam was performed according to the departmental dose-optimization program which includes automated exposure control, adjustment of the mA and/or kV according to patient size and/or use of iterative reconstruction technique. CONTRAST:  85mL OMNIPAQUE IOHEXOL 300 MG/ML  SOLN COMPARISON:  Multiple priors including most recent CT August 08, 2021 FINDINGS: CT CHEST FINDINGS Cardiovascular: Normal caliber thoracic aorta. No central pulmonary embolus on this nondedicated study. Normal size heart. No significant pericardial effusion/thickening. Mediastinum/Nodes: No suspicious thyroid nodule. No pathologically enlarged mediastinal, hilar or axillary lymph nodes. Previously identified subcutaneous left lower axillary soft tissue nodule is not seen on today's examination small hiatal hernia. Lungs/Pleura: Stable small bilateral calcified and noncalcified pulmonary nodules unchanged dating back to May 21, 2009. For instance a 3 mm pulmonary nodule in the right upper lobe on image 73/4 is unchanged. No new suspicious pulmonary nodules or masses. Unchanged scarring in the lingula and right lower lobe. No pleural effusion. No pneumothorax. Musculoskeletal: No aggressive lytic or blastic lesion of bone. Multilevel degenerative changes spine with bridging anterior vertebral osteophytes. CT ABDOMEN PELVIS FINDINGS Hepatobiliary: No suspicious hepatic lesion. Gallbladder surgically absent. No biliary ductal dilation. Pancreas: No pancreatic ductal dilation or evidence of acute inflammation.  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 Adrenals/Urinary Tract: Bilateral adrenal glands appear normal. No hydronephrosis.  Tiny hypodense bilateral renal lesions are technically too small to accurately characterize but statistically likely to reflect cysts. These are considered benign requiring no independent imaging follow-up. Urinary bladder is unremarkable for degree of distension. Stomach/Bowel: Radiopaque enteric contrast material traverses the proximal colon. Stomach is unremarkable for degree of distension. No pathologic dilation of small or large bowel. Similar changes of partial colectomy without evidence of local recurrence. Moderate volume of formed stool in the colon. Colonic diverticulosis without findings of acute diverticulitis. Vascular/Lymphatic: Normal caliber abdominal aorta. Smooth IVC contours. No pathologically enlarged abdominal or pelvic lymph nodes. Reproductive: Prostate is unremarkable. Other: No significant abdominopelvic free fluid. No discrete peritoneal or omental nodularity. Similar appearance of the complex ventral hernia which has multiple apertures in the abdominal wall containing varying degrees of fat and nonobstructed bowel. Musculoskeletal: No aggressive lytic or blastic lesion of bone. Multilevel degenerative changes spine. Degenerative change of the bilateral hips and SI joints with partial bony ankylosis of the SI joint. IMPRESSION: 1. Stable examination without evidence of local recurrence or metastatic disease within the chest, abdomen, or pelvis. 2. Colonic diverticulosis without findings of acute diverticulitis. 3. Moderate volume of formed stool in the colon. 4. Similar appearance of the complex ventral hernia containing fat and nonobstructed portions of bowel. Electronically Signed   By: Maudry Mayhew M.D.   On: 09/06/2022 07:04    Medications: I have reviewed the patient's current medications.   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/16/2021 Transverse colon mass noted on colonoscopy 3/29/202-invasive moderately  differentiated adenocarcinoma, no loss of mismatch repair protein expression CTs 08/08/2021-left low axillary chest wall nodule measuring 1.1 cm, postoperative findings in the left colon, complex ventral hernia Transverse colon cancer 09/16/2021-MSS, no loss of mismatch repair protein expression, lymphovascular invasion present, no tumor deposits Cycle 1 Xeloda 11/07/2021 Cycle 2 Xeloda 11/28/2021 Cycle 3 Xeloda 12/19/2021 Cycle 4 Xeloda 01/09/2022 Cycle 5 held due to progressive hand-foot syndrome Cycle 5 Xeloda 02/06/2022, Xeloda dose adjusted to 1500 mg twice daily for 14 days followed by 7-day break, Voltaren gel initiated-he did not begin Voltaren gel 02/23/2022 Cycle 6 Xeloda 02/27/2022 Cycle 7 Xeloda 03/20/2022 Cycle 8 Xeloda 04/10/2022 CTs 09/05/2022-no evidence of recurrent disease, complex ventral hernia 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 difficile, 10-day course of vancomycin completed    Disposition: [name] is in clinical remission from colon cancer.  He will return for an office visit and CEA in 6 months.  He will continue colonoscopy surveillance with Dr. Kristen Cardinal, MD  09/08/2022  11:34 AM

## 2022-09-13 DIAGNOSIS — H401130 Primary open-angle glaucoma, bilateral, stage unspecified: Secondary | ICD-10-CM | POA: Diagnosis not present

## 2022-09-19 ENCOUNTER — Encounter: Payer: Self-pay | Admitting: Oncology

## 2022-11-15 DIAGNOSIS — H40021 Open angle with borderline findings, high risk, right eye: Secondary | ICD-10-CM | POA: Diagnosis not present

## 2022-11-15 DIAGNOSIS — E119 Type 2 diabetes mellitus without complications: Secondary | ICD-10-CM | POA: Diagnosis not present

## 2022-11-15 DIAGNOSIS — H04123 Dry eye syndrome of bilateral lacrimal glands: Secondary | ICD-10-CM | POA: Diagnosis not present

## 2022-11-15 DIAGNOSIS — H401122 Primary open-angle glaucoma, left eye, moderate stage: Secondary | ICD-10-CM | POA: Diagnosis not present

## 2022-11-22 DIAGNOSIS — G4733 Obstructive sleep apnea (adult) (pediatric): Secondary | ICD-10-CM | POA: Diagnosis not present

## 2022-12-20 ENCOUNTER — Telehealth: Payer: Self-pay | Admitting: *Deleted

## 2022-12-20 NOTE — Telephone Encounter (Signed)
Reports he had #3 bloody stools yesterday (bright red and black as well)-felt sweaty and flushed. Laid down on bed and slept 6 hours. Today still feels weak. Had stool today with faint amount of red blood noted. Having no abdominal pain or nausea. Reports mild gastric pain few days ago.  Last colonoscopy 09/19/22-showed inflamed mucosa, 2 polyps and internal hemorrhoids. Informed him that he would best be served to reach out to Dr. Levora Angel, but will seek Dr. Kalman Drape opinion at request of Mr. Yanke.

## 2022-12-21 NOTE — Telephone Encounter (Signed)
Per Dr. Truett Perna: Needs to call GI with his symptoms. Notified Mr. [name] to call GI today. He reports formed stool x 2 since last conversation and still as blood.

## 2023-01-10 ENCOUNTER — Encounter: Payer: Self-pay | Admitting: Podiatry

## 2023-01-10 ENCOUNTER — Ambulatory Visit: Payer: Medicare Other | Admitting: Podiatry

## 2023-01-10 DIAGNOSIS — M79675 Pain in left toe(s): Secondary | ICD-10-CM | POA: Diagnosis not present

## 2023-01-10 DIAGNOSIS — M79674 Pain in right toe(s): Secondary | ICD-10-CM

## 2023-01-10 DIAGNOSIS — B351 Tinea unguium: Secondary | ICD-10-CM

## 2023-01-10 MED ORDER — DOXYCYCLINE HYCLATE 100 MG PO TABS
100.0000 mg | ORAL_TABLET | Freq: Two times a day (BID) | ORAL | 0 refills | Status: DC
Start: 2023-01-10 — End: 2023-03-28

## 2023-01-10 NOTE — Progress Notes (Signed)
This patient presents to the office with chief complaint of long thick painful nails.  Patient says the nails are painful walking and wearing shoes.  This patient is unable to self treat.  This patient is unable to trim his nails since he is unable to reach his nails.     He presents to the office for preventative foot care services.  General Appearance  Alert, conversant and in no acute stress.  Vascular  Dorsalis pedis and posterior tibial  pulses are palpable  bilaterally.  Capillary return is within normal limits  bilaterally. Temperature is within normal limits  bilaterally.  Neurologic  Senn-Weinstein monofilament wire test within normal limits  bilaterally. Muscle power within normal limits bilaterally.  Nails Thick disfigured discolored nails with subungual debris  from hallux to fifth toes bilaterally. No evidence of bacterial infection or drainage bilaterally.  Orthopedic  No limitations of motion  feet .  No crepitus or effusions noted.  No bony pathology or digital deformities noted.  Skin  normotropic skin with no porokeratosis noted bilaterally.  No signs of infections or ulcers noted.     Onychomycosis  Nails  B/L.  Pain in right toes  Pain in left toes  Debridement of nails both feet followed trimming the nails with dremel    RTC 3 months     Nicholes Rough D.P.M.

## 2023-01-10 NOTE — Addendum Note (Signed)
Addended by: Nicholes Rough on: 01/10/2023 10:37 AM   Modules accepted: Orders

## 2023-01-23 DIAGNOSIS — E1169 Type 2 diabetes mellitus with other specified complication: Secondary | ICD-10-CM | POA: Diagnosis not present

## 2023-01-23 DIAGNOSIS — R5383 Other fatigue: Secondary | ICD-10-CM | POA: Diagnosis not present

## 2023-01-23 DIAGNOSIS — E782 Mixed hyperlipidemia: Secondary | ICD-10-CM | POA: Diagnosis not present

## 2023-01-23 DIAGNOSIS — E119 Type 2 diabetes mellitus without complications: Secondary | ICD-10-CM | POA: Diagnosis not present

## 2023-01-23 DIAGNOSIS — I10 Essential (primary) hypertension: Secondary | ICD-10-CM | POA: Diagnosis not present

## 2023-03-28 ENCOUNTER — Inpatient Hospital Stay: Payer: Medicare Other | Admitting: Oncology

## 2023-03-28 ENCOUNTER — Inpatient Hospital Stay: Payer: Medicare Other | Attending: Oncology

## 2023-03-28 ENCOUNTER — Telehealth: Payer: Self-pay

## 2023-03-28 VITALS — BP 137/70 | HR 58 | Temp 97.9°F | Resp 18 | Ht 66.0 in | Wt 234.4 lb

## 2023-03-28 DIAGNOSIS — C184 Malignant neoplasm of transverse colon: Secondary | ICD-10-CM | POA: Diagnosis not present

## 2023-03-28 DIAGNOSIS — I10 Essential (primary) hypertension: Secondary | ICD-10-CM | POA: Diagnosis not present

## 2023-03-28 DIAGNOSIS — Z8619 Personal history of other infectious and parasitic diseases: Secondary | ICD-10-CM | POA: Diagnosis not present

## 2023-03-28 DIAGNOSIS — E785 Hyperlipidemia, unspecified: Secondary | ICD-10-CM | POA: Diagnosis not present

## 2023-03-28 DIAGNOSIS — Z860101 Personal history of adenomatous and serrated colon polyps: Secondary | ICD-10-CM | POA: Diagnosis not present

## 2023-03-28 LAB — CEA (ACCESS): CEA (CHCC): 2.73 ng/mL (ref 0.00–5.00)

## 2023-03-28 NOTE — Telephone Encounter (Signed)
Patient gave verbal understanding had no further questions or concerns. 

## 2023-03-28 NOTE — Progress Notes (Signed)
  [facility] OFFICE PROGRESS NOTE   Diagnosis: Colon cancer  INTERVAL HISTORY:   Mr. Coole returns as scheduled.  Good appetite.  He has frequent bowel movements.  No bleeding.  He feels depressed.  He would like to see a Veterinary surgeon.  Objective:  Vital signs in last 24 hours:  Blood pressure 137/70, pulse (!) 58, temperature 97.9 F (36.6 C), temperature source Temporal, resp. rate 18, height 5\' 6"  (1.676 m), weight 234 lb 6.4 oz (106.3 kg), SpO2 98%.    Lymphatics: No cervical, supraclavicular, axillary, or inguinal nodes Resp: Lungs clear bilaterally Cardio: Regular rate and rhythm distant heart sounds GI: No mass, nontender, no hepatosplenomegaly Vascular: No leg edema   Lab Results:  Lab Results  Component Value Date   WBC 6.9 05/11/2022   HGB 14.7 05/11/2022   HCT 41.3 05/11/2022   MCV 99.5 05/11/2022   PLT 207 05/11/2022   NEUTROABS 4.4 05/11/2022    CMP  Lab Results  Component Value Date   NA 140 09/05/2022   K 4.3 09/05/2022   CL 104 09/05/2022   CO2 29 09/05/2022   GLUCOSE 141 (H) 09/05/2022   BUN 15 09/05/2022   CREATININE 1.05 09/05/2022   CALCIUM 9.7 09/05/2022   PROT 6.6 05/11/2022   ALBUMIN 4.1 05/11/2022   AST 17 05/11/2022   ALT 13 05/11/2022   ALKPHOS 56 05/11/2022   BILITOT 0.7 05/11/2022   GFRNONAA >60 09/05/2022   GFRAA  12/11/2007    >60        The eGFR has been calculated using the MDRD equation. This calculation has not been validated in all clinical    Lab Results  Component Value Date   CEA 2.73 03/28/2023    No results found for: "INR", "LABPROT"  Imaging:  No results found.  Medications: I have reviewed the patient's current medications.   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/16/2021 Transverse colon mass noted on colonoscopy 3/29/202-invasive moderately differentiated adenocarcinoma, no loss of mismatch repair protein  expression CTs 08/08/2021-left low axillary chest wall nodule measuring 1.1 cm, postoperative findings in the left colon, complex ventral hernia Transverse colon cancer 09/16/2021-MSS, no loss of mismatch repair protein expression, lymphovascular invasion present, no tumor deposits Cycle 1 Xeloda 11/07/2021 Cycle 2 Xeloda 11/28/2021 Cycle 3 Xeloda 12/19/2021 Cycle 4 Xeloda 01/09/2022 Cycle 5 held due to progressive hand-foot syndrome Cycle 5 Xeloda 02/06/2022, Xeloda dose adjusted to 1500 mg twice daily for 14 days followed by 7-day break, Voltaren gel initiated-he did not begin Voltaren gel 02/23/2022 Cycle 6 Xeloda 02/27/2022 Cycle 7 Xeloda 03/20/2022 Cycle 8 Xeloda 04/10/2022 CTs 09/05/2022-no evidence of recurrent disease, complex ventral hernia 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 difficile, 10-day course of vancomycin completed     Disposition: [name] is in clinical remission from colon cancer.  The CEA is normal.  He will return for an office visit and surveillance CTs in 6 months.  We will refer him to the cancer center social worker for counseling.  He should see Dr. Clelia Croft if an antidepressant is recommended.  Thornton Papas, MD  03/28/2023  11:53 AM

## 2023-03-28 NOTE — Telephone Encounter (Signed)
-----   Message from Thornton Papas sent at 03/28/2023  2:29 PM EST ----- Please call patient, the CEA is normal, follow-up as scheduled

## 2023-03-29 ENCOUNTER — Telehealth: Payer: Self-pay

## 2023-03-29 NOTE — Telephone Encounter (Signed)
[facility] Clinical Social Work  Clinical Social Work was referred by nurse for assessment of psychosocial needs.  Clinical Social Worker attempted to contact patient by phone to offer support and assess for needs.  No answer, left vm with direct contact information.   Marguerita Merles, LCSWA Clinical Social Worker [facility]

## 2023-03-30 DIAGNOSIS — C184 Malignant neoplasm of transverse colon: Secondary | ICD-10-CM | POA: Diagnosis not present

## 2023-04-02 ENCOUNTER — Telehealth: Payer: Self-pay

## 2023-04-02 NOTE — Telephone Encounter (Signed)
[facility] Clinical Social Work  Clinical Social Work was referred by nurse for assessment of psychosocial needs. Clinical Social Worker attempted to contact patient by phone x2 to offer support and assess for needs. CSW left voicemail with direct contact and the name of one community agency for referral need. Referral will be closed. Referral can be resent if needed.  Marguerita Merles, LCSWA Clinical Social Worker [facility]

## 2023-05-04 ENCOUNTER — Encounter: Payer: Self-pay | Admitting: Podiatry

## 2023-05-04 ENCOUNTER — Ambulatory Visit: Payer: Medicare Other | Admitting: Podiatry

## 2023-05-04 DIAGNOSIS — M79675 Pain in left toe(s): Secondary | ICD-10-CM

## 2023-05-04 DIAGNOSIS — B351 Tinea unguium: Secondary | ICD-10-CM | POA: Diagnosis not present

## 2023-05-04 DIAGNOSIS — M79674 Pain in right toe(s): Secondary | ICD-10-CM | POA: Diagnosis not present

## 2023-05-04 DIAGNOSIS — E119 Type 2 diabetes mellitus without complications: Secondary | ICD-10-CM

## 2023-05-04 NOTE — Progress Notes (Signed)
This patient presents to the office with chief complaint of long thick painful nails.  Patient says the nails are painful walking and wearing shoes.  This patient is unable to self treat.  This patient is unable to trim his nails since he is unable to reach his nails.     He presents to the office for preventative foot care services.  General Appearance  Alert, conversant and in no acute stress.  Vascular  Dorsalis pedis and posterior tibial  pulses are palpable  bilaterally.  Capillary return is within normal limits  bilaterally. Temperature is within normal limits  bilaterally.  Neurologic  Senn-Weinstein monofilament wire test within normal limits  bilaterally. Muscle power within normal limits bilaterally.  Nails Thick disfigured discolored nails with subungual debris  from hallux to fifth toes bilaterally. No evidence of bacterial infection or drainage bilaterally.  Orthopedic  No limitations of motion  feet .  No crepitus or effusions noted.  No bony pathology or digital deformities noted.  Skin  normotropic skin with no porokeratosis noted bilaterally.  No signs of infections or ulcers noted.     Onychomycosis  Nails  B/L.  Pain in right toes  Pain in left toes  Debridement of nails both feet followed trimming the nails with dremel    RTC 3 months    Gardiner Barefoot DPM

## 2023-07-03 ENCOUNTER — Ambulatory Visit: Payer: Medicare Other | Admitting: Orthopaedic Surgery

## 2023-07-03 ENCOUNTER — Encounter: Payer: Self-pay | Admitting: Orthopaedic Surgery

## 2023-07-03 ENCOUNTER — Other Ambulatory Visit: Payer: Self-pay

## 2023-07-03 VITALS — BP 165/72 | HR 50 | Ht 66.0 in | Wt 230.0 lb

## 2023-07-03 DIAGNOSIS — M25521 Pain in right elbow: Secondary | ICD-10-CM

## 2023-07-03 DIAGNOSIS — M25562 Pain in left knee: Secondary | ICD-10-CM | POA: Diagnosis not present

## 2023-07-03 NOTE — Progress Notes (Signed)
 Office Visit Note   Patient: [name]           Date of Birth: 1946-04-11           MRN: [id number] Visit Date: 07/03/2023              Requested by: Lupita Raider, MD [address] [place],  [place] [address] PCP: Lupita Raider, MD   Assessment & Plan: Visit Diagnoses:  1. Acute pain of left knee   2. Pain in right elbow     Plan: Patient was some distal biceps tendinopathy versus left bicep strain.  We discussed using some Voltaren cream 2 Aleve twice a day for couple weeks with food.  His knee is getting better gradually and only has trace knee effusion.  Follow-Up Instructions: No follow-ups on file.   Orders:  Orders Placed This Encounter  Procedures   XR KNEE 3 VIEW LEFT   XR Elbow 2 Views Right   No orders of the defined types were placed in this encounter.     Procedures: No procedures performed   Clinical Data: No additional findings.   Subjective: Chief Complaint  Patient presents with   Right Elbow - Pain   Left Knee - Pain    HPI old male77-year with biceps tenodesis of the shoulder on the right with Dr. Cleophas Dunker years ago also 2 meniscus surgeries left knee an episode where [place] beach the ladder moved shifted and he went down twisting his knee from third step with the ladder.  He states thankfully gotten better it still slightly puffy but not extremely painful.  He has also had problems with his right elbow since October when he was trying to get a jammed window lifted up with his arm outstretched with sharp pain over the biceps.  History of previous biceps tenodesis right shoulder.  Pain bothers him most at the end of the night no numbness or tingling in his hand he is right-hand dominant.  Still has about note the beach but is looking to sell it patient has type 2 diabetes history of colon cancer previous right Hemi colectomy.  Review of Systems no fever chills no associated neck pain no hand  numbness.   Objective: Vital Signs: BP (!) 165/72   Pulse (!) 50   Ht 5\' 6"  (1.676 m)   Wt 230 lb (104.3 kg)   BMI 37.12 kg/m   Physical Exam Constitutional:      Appearance: He is well-developed.  HENT:     Head: Normocephalic and atraumatic.     Right Ear: External ear normal.     Left Ear: External ear normal.  Eyes:     Pupils: Pupils are equal, round, and reactive to light.  Neck:     Thyroid: No thyromegaly.     Trachea: No tracheal deviation.  Cardiovascular:     Rate and Rhythm: Normal rate.  Pulmonary:     Effort: Pulmonary effort is normal.     Breath sounds: No wheezing.  Abdominal:     General: Bowel sounds are normal.     Palpations: Abdomen is soft.  Musculoskeletal:     Cervical back: Neck supple.  Skin:    General: Skin is warm and dry.     Capillary Refill: Capillary refill takes less than 2 seconds.  Neurological:     Mental Status: He is alert and oriented to person, place, and time.  Psychiatric:  Behavior: Behavior normal.        Thought Content: Thought content normal.        Judgment: Judgment normal.     Ortho Exam patient has tenderness over the distal biceps tendon in the antecubital fossa.  No palpable thickening no palpable defect.  Tenderness extends through the biceps and all the way up to the bicipital groove.  He can get his arm up over his shoulder good cervical range of motion sensation the hand is intact.  Minimal tenderness lateral epicondyle.  No tenderness over the triceps full elbow extension and flexion.  Left knee has well-healed arthroscopic portals trace knee effusion collateral ligaments are stable full flexion good extension.  He has slight tenderness along the lateral joint line posteriorly and medial joint line posteriorly.  ACL PCL normal medial lateral collateral ligaments are stable.  Specialty Comments:  No specialty comments available.  Imaging: No results found.   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  Past Surgical History:  Procedure Laterality Date   bicep surgery Bilateral    CHOLECYSTECTOMY     COLON SURGERY     EYE SURGERY     KNEE SURGERY Left    LAPAROSCOPIC RIGHT HEMI COLECTOMY Right 09/16/2021   Procedure: LAPAROSCOPIC EXTENDED RIGHT HEMI COLECTOMY;  Surgeon: Andria Meuse, MD;  Location: [facility];  Service: General;  Laterality: Right;   MASS EXCISION Left 09/16/2021   Procedure: EXCISION MASS LEFT CHEST WALL;  Surgeon: Andria Meuse, MD;  Location: [facility];  Service: General;  Laterality: Left;   SHOULDER SURGERY Left    Social History   Occupational History   Not on file  Tobacco Use   Smoking status: Former    Types: Cigarettes, Cigars   Smokeless tobacco: Never  Vaping Use   Vaping status: Never Used  Substance and Sexual Activity   Alcohol use: Yes    Alcohol/week: 9.0 standard drinks of alcohol    Types: 6 Cans of beer, 3 Shots of liquor per week    Comment: social   Drug use: No   Sexual activity: Not on file

## 2023-07-04 DIAGNOSIS — H409 Unspecified glaucoma: Secondary | ICD-10-CM | POA: Diagnosis not present

## 2023-07-04 DIAGNOSIS — I10 Essential (primary) hypertension: Secondary | ICD-10-CM | POA: Diagnosis not present

## 2023-07-04 DIAGNOSIS — G9529 Other cord compression: Secondary | ICD-10-CM | POA: Diagnosis not present

## 2023-07-04 DIAGNOSIS — Z0000 Encounter for general adult medical examination without abnormal findings: Secondary | ICD-10-CM | POA: Diagnosis not present

## 2023-07-04 DIAGNOSIS — E782 Mixed hyperlipidemia: Secondary | ICD-10-CM | POA: Diagnosis not present

## 2023-07-04 DIAGNOSIS — L409 Psoriasis, unspecified: Secondary | ICD-10-CM | POA: Diagnosis not present

## 2023-07-04 DIAGNOSIS — E1169 Type 2 diabetes mellitus with other specified complication: Secondary | ICD-10-CM | POA: Diagnosis not present

## 2023-07-04 DIAGNOSIS — J8410 Pulmonary fibrosis, unspecified: Secondary | ICD-10-CM | POA: Diagnosis not present

## 2023-07-04 DIAGNOSIS — Z23 Encounter for immunization: Secondary | ICD-10-CM | POA: Diagnosis not present

## 2023-07-04 DIAGNOSIS — G4733 Obstructive sleep apnea (adult) (pediatric): Secondary | ICD-10-CM | POA: Diagnosis not present

## 2023-07-10 ENCOUNTER — Encounter: Payer: Self-pay | Admitting: *Deleted

## 2023-08-20 DIAGNOSIS — H52223 Regular astigmatism, bilateral: Secondary | ICD-10-CM | POA: Diagnosis not present

## 2023-08-20 DIAGNOSIS — H524 Presbyopia: Secondary | ICD-10-CM | POA: Diagnosis not present

## 2023-08-20 DIAGNOSIS — H401130 Primary open-angle glaucoma, bilateral, stage unspecified: Secondary | ICD-10-CM | POA: Diagnosis not present

## 2023-08-22 ENCOUNTER — Inpatient Hospital Stay: Attending: Oncology

## 2023-08-22 ENCOUNTER — Ambulatory Visit (HOSPITAL_BASED_OUTPATIENT_CLINIC_OR_DEPARTMENT_OTHER)
Admission: RE | Admit: 2023-08-22 | Discharge: 2023-08-22 | Disposition: A | Discharge status: Home / Self Care | Source: Ambulatory Visit | Attending: Oncology | Admitting: Oncology

## 2023-08-22 ENCOUNTER — Encounter (HOSPITAL_BASED_OUTPATIENT_CLINIC_OR_DEPARTMENT_OTHER): Payer: Self-pay

## 2023-08-22 DIAGNOSIS — C184 Malignant neoplasm of transverse colon: Secondary | ICD-10-CM

## 2023-08-22 DIAGNOSIS — Z8619 Personal history of other infectious and parasitic diseases: Secondary | ICD-10-CM | POA: Insufficient documentation

## 2023-08-22 DIAGNOSIS — Z85038 Personal history of other malignant neoplasm of large intestine: Secondary | ICD-10-CM | POA: Insufficient documentation

## 2023-08-22 DIAGNOSIS — N281 Cyst of kidney, acquired: Secondary | ICD-10-CM | POA: Diagnosis not present

## 2023-08-22 DIAGNOSIS — C189 Malignant neoplasm of colon, unspecified: Secondary | ICD-10-CM | POA: Diagnosis not present

## 2023-08-22 DIAGNOSIS — Z85020 Personal history of malignant carcinoid tumor of stomach: Secondary | ICD-10-CM | POA: Insufficient documentation

## 2023-08-22 DIAGNOSIS — K439 Ventral hernia without obstruction or gangrene: Secondary | ICD-10-CM | POA: Diagnosis not present

## 2023-08-22 DIAGNOSIS — K429 Umbilical hernia without obstruction or gangrene: Secondary | ICD-10-CM | POA: Diagnosis not present

## 2023-08-22 LAB — BASIC METABOLIC PANEL - CANCER CENTER ONLY
Anion gap: 7 (ref 5–15)
BUN: 13 mg/dL (ref 8–23)
CO2: 30 mmol/L (ref 22–32)
Calcium: 10.3 mg/dL (ref 8.9–10.3)
Chloride: 103 mmol/L (ref 98–111)
Creatinine: 1.18 mg/dL (ref 0.61–1.24)
GFR, Estimated: >60 mL/min (ref >60)
Glucose, Bld: 120 mg/dL — ABNORMAL HIGH (ref 70–99)
Potassium: 4.2 mmol/L (ref 3.5–5.1)
Sodium: 140 mmol/L (ref 135–145)

## 2023-08-22 LAB — CEA (ACCESS): CEA (CHCC): 2.79 ng/mL (ref 0.00–5.00)

## 2023-08-22 MED ORDER — IOHEXOL 300 MG/ML  SOLN
100.0000 mL | Freq: Once | INTRAMUSCULAR | Status: AC | PRN
  Administered 2023-08-22: 100 mL via INTRAVENOUS

## 2023-08-29 ENCOUNTER — Inpatient Hospital Stay: Payer: Medicare Other | Admitting: Nurse Practitioner

## 2023-08-29 ENCOUNTER — Encounter: Payer: Self-pay | Admitting: Nurse Practitioner

## 2023-08-29 VITALS — BP 149/86 | HR 60 | Temp 98.1°F | Resp 18 | Ht 66.0 in | Wt 235.2 lb

## 2023-08-29 DIAGNOSIS — Z85038 Personal history of other malignant neoplasm of large intestine: Secondary | ICD-10-CM | POA: Diagnosis not present

## 2023-08-29 DIAGNOSIS — C184 Malignant neoplasm of transverse colon: Secondary | ICD-10-CM | POA: Diagnosis not present

## 2023-08-29 DIAGNOSIS — Z8619 Personal history of other infectious and parasitic diseases: Secondary | ICD-10-CM | POA: Diagnosis not present

## 2023-08-29 DIAGNOSIS — Z85020 Personal history of malignant carcinoid tumor of stomach: Secondary | ICD-10-CM | POA: Diagnosis not present

## 2023-08-29 NOTE — Progress Notes (Signed)
 Patient Care Team: Glena Landau, MD as PCP - General (Family Medicine)   CHIEF COMPLAINT: Follow up colon cancer  CURRENT THERAPY: Surveillance  INTERVAL HISTORY [name] returns for follow up as scheduled. Last seen by Dr. Scherrie Curt 03/28/23, had lab and CT on 4/16.  Overall he is doing well, back on Rybelsus to lose weight.  Bowels move frequently since surgery but at baseline, no blood in stools.  Denies abdominal pain, nausea/vomiting.  He continues to deal with high amount of family stress but is dealing with it better, depression has improved.  He talks to his PCP about this. He is nervous for scan results.    ROS  All other systems reviewed and negative  Past Medical History:  Diagnosis Date   Anxiety    Cancer (HCC)    colon cancer, skin cancer   Depression    Hyperlipidemia    Hypertension    Pre-diabetes    Sleep apnea    cpap     Past Surgical History:  Procedure Laterality Date   bicep surgery Bilateral    CHOLECYSTECTOMY     COLON SURGERY     EYE SURGERY     KNEE SURGERY Left    LAPAROSCOPIC RIGHT HEMI COLECTOMY Right 09/16/2021   Procedure: LAPAROSCOPIC EXTENDED RIGHT HEMI COLECTOMY;  Surgeon: Melvenia Stabs, MD;  Location: [facility];  Service: General;  Laterality: Right;   MASS EXCISION Left 09/16/2021   Procedure: EXCISION MASS LEFT CHEST WALL;  Surgeon: Melvenia Stabs, MD;  Location: [facility];  Service: General;  Laterality: Left;   SHOULDER SURGERY Left      Outpatient Encounter Medications as of 08/29/2023  Medication Sig   aspirin 81 MG tablet Take 81 mg by mouth every other day.   cholecalciferol  (VITAMIN D3) 25 MCG (1000 UNIT) tablet Take 1,000 Units by mouth daily.   Chromium Picolinate 800 MCG TABS Take 800 mcg by mouth daily.   cyanocobalamin (VITAMIN B12) 1000 MCG tablet Take 1,000 mcg by mouth daily.   diazepam  (VALIUM ) 5 MG tablet Take 5 mg by mouth 2 (two) times daily as needed for anxiety.   dorzolamide -timolol  (COSOPT )  22.3-6.8 MG/ML ophthalmic solution Place 1 drop into both eyes 2 (two) times daily.   fenofibrate  160 MG tablet Take 160 mg by mouth daily.   Omega-3 Fatty Acids (FISH OIL) 1200 MG CAPS Take 1,200 mg by mouth in the morning and at bedtime.   RYBELSUS 7 MG TABS Take 1 tablet by mouth daily.   simvastatin  (ZOCOR ) 20 MG tablet Take 20 mg by mouth every evening.   Vitamin A 2400 MCG (8000 UT) CAPS Take 8,000 Units by mouth daily.   vitamin C (ASCORBIC ACID) 500 MG tablet Take 500 mg by mouth daily.   zinc gluconate 50 MG tablet Take 50 mg by mouth daily.   sildenafil (REVATIO) 20 MG tablet Take by mouth. As needed (Patient not taking: Reported on 08/29/2023)   No facility-administered encounter medications on file as of 08/29/2023.     Today's Vitals   08/29/23 0943 08/29/23 0944 08/29/23 0955  BP: (!) 149/88 (!) 149/86   Pulse: 60    Resp: 18    Temp: 98.1 F (36.7 C)    TempSrc: Temporal    SpO2: 98%    Weight: 235 lb 3.2 oz (106.7 kg)    Height: 5\' 6"  (1.676 m)    PainSc:   0-No pain   Body mass index is 37.96 kg/m.  PHYSICAL EXAM GENERAL:alert, no distress and comfortable SKIN: no rash  EYES: sclera clear NECK: without mass LYMPH:  no palpable cervical or supraclavicular lymphadenopathy  LUNGS: clear with normal breathing effort HEART: regular rate & rhythm, no lower extremity edema ABDOMEN: abdomen soft, non-tender and normal bowel sounds NEURO: alert & oriented x 3 with fluent speech, no focal motor/sensory deficits    CMP     Component Value Date/Time   NA 140 08/22/2023 0826   K 4.2 08/22/2023 0826   CL 103 08/22/2023 0826   CO2 30 08/22/2023 0826   GLUCOSE 120 (H) 08/22/2023 0826   BUN 13 08/22/2023 0826   CREATININE 1.18 08/22/2023 0826   CALCIUM 10.3 08/22/2023 0826   PROT 6.6 05/11/2022 0955   ALBUMIN 4.1 05/11/2022 0955   AST 17 05/11/2022 0955   ALT 13 05/11/2022 0955   ALKPHOS 56 05/11/2022 0955   BILITOT 0.7 05/11/2022 0955   GFRNONAA >60  08/22/2023 0826   GFRAA  12/11/2007 1405    >60        The eGFR has been calculated using the MDRD equation. This calculation has not been validated in all clinical     ASSESSMENT & PLAN: 78 yo male    Colon cancer, transverse, stage IIIa (T3N1), status post an extended right hemicolectomy 09/16/2021 Transverse colon mass noted on colonoscopy 3/29/202-invasive moderately differentiated adenocarcinoma, no loss of mismatch repair protein expression CTs 08/08/2021-left low axillary chest wall nodule measuring 1.1 cm, postoperative findings in the left colon, complex ventral hernia Transverse colon cancer 09/16/2021-MSS, no loss of mismatch repair protein expression, lymphovascular invasion present, no tumor deposits Cycle 1 Xeloda  11/07/2021 Cycle 2 Xeloda  11/28/2021 Cycle 3 Xeloda  12/19/2021 Cycle 4 Xeloda  01/09/2022 Cycle 5 held due to progressive hand-foot syndrome Cycle 5 Xeloda  02/06/2022, Xeloda  dose adjusted to 1500 mg twice daily for 14 days followed by 7-day break, Voltaren gel initiated-he did not begin Voltaren gel 02/23/2022 Cycle 6 Xeloda  02/27/2022 Cycle 7 Xeloda  03/20/2022 Cycle 8 Xeloda  04/10/2022 CTs 09/05/2022-no evidence of recurrent disease, complex ventral hernia CT 08/22/2023  - No evidence of recurrent disease, incidentals stable 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 difficile, 10-day course of vancomycin  completed Depression, improved overall with better coping skills. Managed by PCP    Disposition:  [name] is clinically doing well. Exam is benign, CEA  is normal. I reviewed the surveillance CT from 08/22/23 which shows no evidence of recurrence. He remains in clinical remission. Continue surveillance and healthy active lifestyle.   He will return for lab and follow up in 6 months, or sooner if needed.   Orders Placed This Encounter  Procedures   CEA (Access)-[facility] ONLY    Standing Status:   Future    Expected Date:   02/28/2024    Expiration Date:   08/28/2024   Basic Metabolic Panel - Cancer Center Only    Standing Status:   Future    Expected Date:   02/28/2024    Expiration Date:   08/28/2024      All questions were answered. The patient knows to call the clinic with any problems, questions or concerns. No barriers to learning were detected.  Cipriana Biller, NP-C 08/29/2023

## 2023-09-04 ENCOUNTER — Encounter: Payer: Self-pay | Admitting: Podiatry

## 2023-09-04 ENCOUNTER — Ambulatory Visit: Admitting: Podiatry

## 2023-09-04 DIAGNOSIS — E119 Type 2 diabetes mellitus without complications: Secondary | ICD-10-CM

## 2023-09-04 DIAGNOSIS — M79675 Pain in left toe(s): Secondary | ICD-10-CM

## 2023-09-04 DIAGNOSIS — B351 Tinea unguium: Secondary | ICD-10-CM | POA: Diagnosis not present

## 2023-09-04 DIAGNOSIS — M79674 Pain in right toe(s): Secondary | ICD-10-CM | POA: Diagnosis not present

## 2023-09-04 NOTE — Progress Notes (Signed)
This patient presents to the office with chief complaint of long thick painful nails.  Patient says the nails are painful walking and wearing shoes.  This patient is unable to self treat.  This patient is unable to trim his nails since he is unable to reach his nails.     He presents to the office for preventative foot care services.  General Appearance  Alert, conversant and in no acute stress.  Vascular  Dorsalis pedis and posterior tibial  pulses are palpable  bilaterally.  Capillary return is within normal limits  bilaterally. Temperature is within normal limits  bilaterally.  Neurologic  Senn-Weinstein monofilament wire test within normal limits  bilaterally. Muscle power within normal limits bilaterally.  Nails Thick disfigured discolored nails with subungual debris  from hallux to fifth toes bilaterally. No evidence of bacterial infection or drainage bilaterally.  Orthopedic  No limitations of motion  feet .  No crepitus or effusions noted.  No bony pathology or digital deformities noted.  Skin  normotropic skin with no porokeratosis noted bilaterally.  No signs of infections or ulcers noted.     Onychomycosis  Nails  B/L.  Pain in right toes  Pain in left toes  Debridement of nails both feet followed trimming the nails with dremel    RTC 3 months    Gardiner Barefoot DPM

## 2023-10-17 DIAGNOSIS — H401131 Primary open-angle glaucoma, bilateral, mild stage: Secondary | ICD-10-CM | POA: Diagnosis not present

## 2023-10-17 DIAGNOSIS — H35371 Puckering of macula, right eye: Secondary | ICD-10-CM | POA: Diagnosis not present

## 2023-12-21 ENCOUNTER — Emergency Department (HOSPITAL_COMMUNITY)

## 2023-12-21 ENCOUNTER — Other Ambulatory Visit: Payer: Self-pay

## 2023-12-21 ENCOUNTER — Emergency Department (HOSPITAL_COMMUNITY)
Admission: EM | Admit: 2023-12-21 | Discharge: 2023-12-21 | Disposition: A | Discharge status: Home / Self Care | Attending: Emergency Medicine | Admitting: Emergency Medicine

## 2023-12-21 ENCOUNTER — Encounter (HOSPITAL_COMMUNITY): Payer: Self-pay

## 2023-12-21 DIAGNOSIS — M25519 Pain in unspecified shoulder: Secondary | ICD-10-CM | POA: Diagnosis not present

## 2023-12-21 DIAGNOSIS — Y92410 Unspecified street and highway as the place of occurrence of the external cause: Secondary | ICD-10-CM | POA: Insufficient documentation

## 2023-12-21 DIAGNOSIS — Z7982 Long term (current) use of aspirin: Secondary | ICD-10-CM | POA: Insufficient documentation

## 2023-12-21 DIAGNOSIS — Z743 Need for continuous supervision: Secondary | ICD-10-CM | POA: Diagnosis not present

## 2023-12-21 DIAGNOSIS — S3991XA Unspecified injury of abdomen, initial encounter: Secondary | ICD-10-CM | POA: Diagnosis not present

## 2023-12-21 DIAGNOSIS — S50812A Abrasion of left forearm, initial encounter: Secondary | ICD-10-CM | POA: Diagnosis not present

## 2023-12-21 DIAGNOSIS — S80212A Abrasion, left knee, initial encounter: Secondary | ICD-10-CM | POA: Diagnosis not present

## 2023-12-21 DIAGNOSIS — S0990XA Unspecified injury of head, initial encounter: Secondary | ICD-10-CM | POA: Insufficient documentation

## 2023-12-21 DIAGNOSIS — S50811A Abrasion of right forearm, initial encounter: Secondary | ICD-10-CM | POA: Diagnosis not present

## 2023-12-21 DIAGNOSIS — M542 Cervicalgia: Secondary | ICD-10-CM | POA: Insufficient documentation

## 2023-12-21 DIAGNOSIS — S199XXA Unspecified injury of neck, initial encounter: Secondary | ICD-10-CM | POA: Diagnosis not present

## 2023-12-21 DIAGNOSIS — S301XXA Contusion of abdominal wall, initial encounter: Secondary | ICD-10-CM | POA: Diagnosis not present

## 2023-12-21 DIAGNOSIS — M19041 Primary osteoarthritis, right hand: Secondary | ICD-10-CM | POA: Diagnosis not present

## 2023-12-21 DIAGNOSIS — R58 Hemorrhage, not elsewhere classified: Secondary | ICD-10-CM | POA: Diagnosis not present

## 2023-12-21 DIAGNOSIS — S6991XA Unspecified injury of right wrist, hand and finger(s), initial encounter: Secondary | ICD-10-CM | POA: Diagnosis present

## 2023-12-21 DIAGNOSIS — K4020 Bilateral inguinal hernia, without obstruction or gangrene, not specified as recurrent: Secondary | ICD-10-CM | POA: Diagnosis not present

## 2023-12-21 DIAGNOSIS — S80211A Abrasion, right knee, initial encounter: Secondary | ICD-10-CM | POA: Diagnosis not present

## 2023-12-21 DIAGNOSIS — S3993XA Unspecified injury of pelvis, initial encounter: Secondary | ICD-10-CM | POA: Diagnosis not present

## 2023-12-21 DIAGNOSIS — S51012A Laceration without foreign body of left elbow, initial encounter: Secondary | ICD-10-CM | POA: Insufficient documentation

## 2023-12-21 DIAGNOSIS — S299XXA Unspecified injury of thorax, initial encounter: Secondary | ICD-10-CM | POA: Diagnosis not present

## 2023-12-21 DIAGNOSIS — S61411A Laceration without foreign body of right hand, initial encounter: Secondary | ICD-10-CM | POA: Insufficient documentation

## 2023-12-21 DIAGNOSIS — J341 Cyst and mucocele of nose and nasal sinus: Secondary | ICD-10-CM | POA: Diagnosis not present

## 2023-12-21 DIAGNOSIS — R609 Edema, unspecified: Secondary | ICD-10-CM | POA: Diagnosis not present

## 2023-12-21 LAB — I-STAT CHEM 8, ED
BUN: 16 mg/dL (ref 8–23)
Calcium, Ion: 1.21 mmol/L (ref 1.15–1.40)
Chloride: 104 mmol/L (ref 98–111)
Creatinine, Ser: 1.1 mg/dL (ref 0.61–1.24)
Glucose, Bld: 108 mg/dL — ABNORMAL HIGH (ref 70–99)
HCT: 44 % (ref 39.0–52.0)
Hemoglobin: 15 g/dL (ref 13.0–17.0)
Potassium: 4.1 mmol/L (ref 3.5–5.1)
Sodium: 138 mmol/L (ref 135–145)
TCO2: 23 mmol/L (ref 22–32)

## 2023-12-21 MED ORDER — IOHEXOL 350 MG/ML SOLN
100.0000 mL | Freq: Once | INTRAVENOUS | Status: AC | PRN
  Administered 2023-12-21: 100 mL via INTRAVENOUS

## 2023-12-21 MED ORDER — ACETAMINOPHEN 500 MG PO TABS
1000.0000 mg | ORAL_TABLET | Freq: Once | ORAL | Status: AC
  Administered 2023-12-21: 1000 mg via ORAL
  Filled 2023-12-21: qty 2

## 2023-12-21 MED ORDER — LIDOCAINE-EPINEPHRINE (PF) 2 %-1:200000 IJ SOLN
10.0000 mL | Freq: Once | INTRAMUSCULAR | Status: AC
  Administered 2023-12-21: 10 mL
  Filled 2023-12-21: qty 20

## 2023-12-21 NOTE — Discharge Instructions (Addendum)
 Please keep dressing and splint in place.  Have your doctor recheck the wound on Monday.  Sutures should be able to be removed after 10 days.  Return if you are having any new or worsening symptoms. You can take 1,000 mg tylenol every 8 hours for pain or discomfort.
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
## 2023-12-21 NOTE — ED Notes (Signed)
 Pt to CT
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
## 2023-12-24 DIAGNOSIS — S61419A Laceration without foreign body of unspecified hand, initial encounter: Secondary | ICD-10-CM | POA: Diagnosis not present

## 2023-12-24 DIAGNOSIS — E782 Mixed hyperlipidemia: Secondary | ICD-10-CM | POA: Diagnosis not present

## 2023-12-24 DIAGNOSIS — Z4802 Encounter for removal of sutures: Secondary | ICD-10-CM | POA: Diagnosis not present

## 2023-12-24 DIAGNOSIS — E1169 Type 2 diabetes mellitus with other specified complication: Secondary | ICD-10-CM | POA: Diagnosis not present

## 2023-12-28 DIAGNOSIS — S61411D Laceration without foreign body of right hand, subsequent encounter: Secondary | ICD-10-CM | POA: Diagnosis not present

## 2023-12-28 DIAGNOSIS — S50812D Abrasion of left forearm, subsequent encounter: Secondary | ICD-10-CM | POA: Diagnosis not present

## 2023-12-28 DIAGNOSIS — T07XXXA Unspecified multiple injuries, initial encounter: Secondary | ICD-10-CM | POA: Diagnosis not present

## 2023-12-28 DIAGNOSIS — S134XXD Sprain of ligaments of cervical spine, subsequent encounter: Secondary | ICD-10-CM | POA: Diagnosis not present

## 2024-01-09 DIAGNOSIS — E782 Mixed hyperlipidemia: Secondary | ICD-10-CM | POA: Diagnosis not present

## 2024-01-09 DIAGNOSIS — E1169 Type 2 diabetes mellitus with other specified complication: Secondary | ICD-10-CM | POA: Diagnosis not present

## 2024-01-09 DIAGNOSIS — G4733 Obstructive sleep apnea (adult) (pediatric): Secondary | ICD-10-CM | POA: Diagnosis not present

## 2024-01-09 DIAGNOSIS — I10 Essential (primary) hypertension: Secondary | ICD-10-CM | POA: Diagnosis not present

## 2024-01-11 ENCOUNTER — Telehealth: Payer: Self-pay | Admitting: Oncology

## 2024-01-11 NOTE — Telephone Encounter (Signed)
 Returning PT call, called because he received a message regarding his CT scan.

## 2024-01-14 DIAGNOSIS — H02054 Trichiasis without entropian left upper eyelid: Secondary | ICD-10-CM | POA: Diagnosis not present

## 2024-01-14 DIAGNOSIS — H53143 Visual discomfort, bilateral: Secondary | ICD-10-CM | POA: Diagnosis not present

## 2024-01-14 DIAGNOSIS — H16142 Punctate keratitis, left eye: Secondary | ICD-10-CM | POA: Diagnosis not present

## 2024-01-29 ENCOUNTER — Other Ambulatory Visit (INDEPENDENT_AMBULATORY_CARE_PROVIDER_SITE_OTHER)

## 2024-01-29 ENCOUNTER — Encounter: Payer: Self-pay | Admitting: Orthopaedic Surgery

## 2024-01-29 ENCOUNTER — Ambulatory Visit: Admitting: Orthopaedic Surgery

## 2024-01-29 DIAGNOSIS — M25562 Pain in left knee: Secondary | ICD-10-CM

## 2024-01-29 DIAGNOSIS — G8929 Other chronic pain: Secondary | ICD-10-CM | POA: Diagnosis not present

## 2024-01-29 DIAGNOSIS — M25561 Pain in right knee: Secondary | ICD-10-CM

## 2024-01-29 MED ORDER — BUPIVACAINE HCL 0.5 % IJ SOLN
2.0000 mL | INTRAMUSCULAR | Status: AC | PRN
  Administered 2024-01-29: 2 mL via INTRA_ARTICULAR

## 2024-01-29 MED ORDER — METHYLPREDNISOLONE ACETATE 40 MG/ML IJ SUSP
40.0000 mg | INTRAMUSCULAR | Status: AC | PRN
  Administered 2024-01-29: 40 mg via INTRA_ARTICULAR

## 2024-01-29 MED ORDER — LIDOCAINE HCL 1 % IJ SOLN
2.0000 mL | INTRAMUSCULAR | Status: AC | PRN
  Administered 2024-01-29: 2 mL
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
## 2024-01-30 ENCOUNTER — Ambulatory Visit: Admitting: Orthopaedic Surgery

## 2024-01-30 ENCOUNTER — Other Ambulatory Visit: Payer: Self-pay

## 2024-01-30 ENCOUNTER — Ambulatory Visit (INDEPENDENT_AMBULATORY_CARE_PROVIDER_SITE_OTHER): Admitting: Sports Medicine

## 2024-01-30 DIAGNOSIS — G8929 Other chronic pain: Secondary | ICD-10-CM

## 2024-01-30 DIAGNOSIS — M19011 Primary osteoarthritis, right shoulder: Secondary | ICD-10-CM | POA: Insufficient documentation

## 2024-01-30 DIAGNOSIS — M25511 Pain in right shoulder: Secondary | ICD-10-CM | POA: Diagnosis not present

## 2024-01-30 DIAGNOSIS — M542 Cervicalgia: Secondary | ICD-10-CM | POA: Insufficient documentation

## 2024-01-30 MED ORDER — LIDOCAINE HCL 1 % IJ SOLN
2.0000 mL | INTRAMUSCULAR | Status: AC | PRN
  Administered 2024-01-30: 2 mL

## 2024-01-30 MED ORDER — METHYLPREDNISOLONE ACETATE 40 MG/ML IJ SUSP
60.0000 mg | INTRAMUSCULAR | Status: AC | PRN
  Administered 2024-01-30: 60 mg via INTRA_ARTICULAR

## 2024-01-30 MED ORDER — BUPIVACAINE HCL 0.25 % IJ SOLN
2.0000 mL | INTRAMUSCULAR | Status: AC | PRN
  Administered 2024-01-30: 2 mL via INTRA_ARTICULAR

## 2024-01-30 NOTE — Progress Notes (Signed)
   Procedure Note  Patient: [name]             Date of Birth: May 23, 1945           MRN: [id number]             Visit Date: 01/30/2024  Procedures: Visit Diagnoses:  1. Primary osteoarthritis, right shoulder   2. Chronic right shoulder pain    Large Joint Inj: R glenohumeral on 01/30/2024 11:00 AM Indications: pain Details: 22 G 3.5 in needle, ultrasound-guided posterior approach Medications: 2 mL lidocaine  1 %; 2 mL bupivacaine  0.25 %; 60 mg methylPREDNISolone  acetate 40 MG/ML Outcome: tolerated well, no immediate complications  US -guided glenohumeral joint injection, right shoulder After discussion on risks/benefits/indications, informed verbal consent was obtained. A timeout was then performed. The patient was positioned lying lateral recumbent on examination table. The patient's shoulder was prepped with betadine and multiple alcohol swabs and utilizing ultrasound guidance, the patient's glenohumeral joint was identified on ultrasound. Using ultrasound guidance a 22-gauge, 3.5 inch needle with a mixture of 2:2:1.5 cc's lidocaine :bupivicaine:depomedrol was directed from a lateral to medial direction via in-plane technique into the glenohumeral joint with visualization of appropriate spread of injectate into the joint. Patient tolerated the procedure well without immediate complications.      Procedure, treatment alternatives, risks and benefits explained, specific risks discussed. Consent was given by the patient. Immediately prior to procedure a time out was called to verify the correct patient, procedure, equipment, support staff and site/side marked as required. Patient was prepped and draped in the usual sterile fashion.     - patient tolerated procedure well, discussed post-injection protocol - follow-up with Dr. Jerri as indicated; I am happy to see them as needed  Lonell Sprang, DO Primary Care Sports Medicine Physician  [facility] - Orthopedics  This note was  dictated using Dragon naturally speaking software and may contain errors in syntax, spelling, or content which have not been identified prior to signing this note.
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
## 2024-02-05 ENCOUNTER — Other Ambulatory Visit: Payer: Self-pay

## 2024-02-05 ENCOUNTER — Ambulatory Visit: Attending: Orthopaedic Surgery

## 2024-02-05 DIAGNOSIS — M542 Cervicalgia: Secondary | ICD-10-CM | POA: Diagnosis not present

## 2024-02-05 DIAGNOSIS — M19011 Primary osteoarthritis, right shoulder: Secondary | ICD-10-CM | POA: Diagnosis not present

## 2024-02-05 DIAGNOSIS — M25511 Pain in right shoulder: Secondary | ICD-10-CM | POA: Diagnosis not present

## 2024-02-05 DIAGNOSIS — R293 Abnormal posture: Secondary | ICD-10-CM | POA: Insufficient documentation

## 2024-02-05 DIAGNOSIS — M6281 Muscle weakness (generalized): Secondary | ICD-10-CM | POA: Insufficient documentation
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
## 2024-02-12 ENCOUNTER — Telehealth: Payer: Self-pay | Admitting: Oncology

## 2024-02-12 NOTE — Telephone Encounter (Signed)
 PT called to reschedule appt. Reason not specified.

## 2024-02-13 ENCOUNTER — Ambulatory Visit

## 2024-02-28 ENCOUNTER — Other Ambulatory Visit

## 2024-02-28 ENCOUNTER — Ambulatory Visit: Admitting: Oncology

## 2024-03-10 ENCOUNTER — Encounter: Payer: Self-pay | Admitting: Radiology

## 2024-03-20 ENCOUNTER — Telehealth: Payer: Self-pay | Admitting: Oncology

## 2024-03-20 NOTE — Telephone Encounter (Signed)
 PT has an appt and needed to change appt time and date

## 2024-03-24 ENCOUNTER — Inpatient Hospital Stay: Admitting: Oncology

## 2024-03-24 ENCOUNTER — Inpatient Hospital Stay: Attending: Oncology

## 2024-03-24 ENCOUNTER — Inpatient Hospital Stay

## 2024-03-24 VITALS — BP 159/86 | HR 63 | Temp 97.8°F | Resp 18 | Ht 66.0 in | Wt 224.5 lb

## 2024-03-24 DIAGNOSIS — F32A Depression, unspecified: Secondary | ICD-10-CM | POA: Diagnosis not present

## 2024-03-24 DIAGNOSIS — C184 Malignant neoplasm of transverse colon: Secondary | ICD-10-CM

## 2024-03-24 DIAGNOSIS — I10 Essential (primary) hypertension: Secondary | ICD-10-CM | POA: Diagnosis not present

## 2024-03-24 DIAGNOSIS — Z23 Encounter for immunization: Secondary | ICD-10-CM

## 2024-03-24 DIAGNOSIS — E785 Hyperlipidemia, unspecified: Secondary | ICD-10-CM | POA: Diagnosis not present

## 2024-03-24 DIAGNOSIS — Z860101 Personal history of adenomatous and serrated colon polyps: Secondary | ICD-10-CM | POA: Diagnosis not present

## 2024-03-24 LAB — CEA (ACCESS): CEA (CHCC): 2.71 ng/mL (ref 0.00–5.00)

## 2024-03-24 LAB — BASIC METABOLIC PANEL - CANCER CENTER ONLY
Anion gap: 11 (ref 5–15)
BUN: 10 mg/dL (ref 8–23)
CO2: 27 mmol/L (ref 22–32)
Calcium: 10.3 mg/dL (ref 8.9–10.3)
Chloride: 102 mmol/L (ref 98–111)
Creatinine: 0.96 mg/dL (ref 0.61–1.24)
GFR, Estimated: >60 mL/min (ref >60)
Glucose, Bld: 176 mg/dL — ABNORMAL HIGH (ref 70–99)
Potassium: 3.9 mmol/L (ref 3.5–5.1)
Sodium: 139 mmol/L (ref 135–145)

## 2024-03-24 MED ORDER — INFLUENZA VAC SPLIT HIGH-DOSE 0.5 ML IM SUSY
0.5000 mL | PREFILLED_SYRINGE | Freq: Once | INTRAMUSCULAR | Status: AC
  Administered 2024-03-24: 0.5 mL via INTRAMUSCULAR
  Filled 2024-03-24: qty 0.5
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
## 2024-03-27 ENCOUNTER — Ambulatory Visit: Admitting: Oncology

## 2024-03-27 ENCOUNTER — Other Ambulatory Visit

## 2024-05-27 ENCOUNTER — Ambulatory Visit: Admitting: Orthopaedic Surgery

## 2024-05-27 DIAGNOSIS — M1712 Unilateral primary osteoarthritis, left knee: Secondary | ICD-10-CM

## 2024-05-27 MED ORDER — LIDOCAINE HCL 1 % IJ SOLN
2.0000 mL | INTRAMUSCULAR | Status: AC | PRN
  Administered 2024-05-27: 2 mL

## 2024-05-27 MED ORDER — METHYLPREDNISOLONE ACETATE 40 MG/ML IJ SUSP
40.0000 mg | INTRAMUSCULAR | Status: AC | PRN
  Administered 2024-05-27: 40 mg via INTRA_ARTICULAR

## 2024-05-27 MED ORDER — BUPIVACAINE HCL 0.5 % IJ SOLN
2.0000 mL | INTRAMUSCULAR | Status: AC | PRN
  Administered 2024-05-27: 2 mL via INTRA_ARTICULAR

## 2024-05-27 NOTE — Progress Notes (Signed)
 "  Office Visit Note   Patient: [name]           Date of Birth: Apr 16, 1946           MRN: [id number] Visit Date: 05/27/2024              Requested by: Loreli Kins, MD [address] [place],  [place] [address] PCP: Loreli Kins, MD   Assessment & Plan: Visit Diagnoses:  1. Primary osteoarthritis of left knee     Plan: Impression is left knee osteoarthritis.  He has fairly severe disease.  Had good response to prior cortisone injection.  We repeated this today.  Follow-up as needed.  Follow-Up Instructions: Return if symptoms worsen or fail to improve.   Orders:  Orders Placed This Encounter  Procedures   Large Joint Inj   No orders of the defined types were placed in this encounter.     Procedures: Large Joint Inj: L knee on 05/27/2024 11:38 AM Details: 22 G needle Medications: 2 mL bupivacaine  0.5 %; 2 mL lidocaine  1 %; 40 mg methylPREDNISolone  acetate 40 MG/ML Outcome: tolerated well, no immediate complications Patient was prepped and draped in the usual sterile fashion.       Clinical Data: No additional findings.   Subjective: Chief Complaint  Patient presents with   Left Knee - Pain    HPI [name] returns today for follow-up of left knee osteoarthritis.  Underwent cortisone injection 4 months ago with good relief.  Has had worsening of symptoms recently and is requesting another injection today.  Denies any injuries. Review of Systems  Constitutional: Negative.   HENT: Negative.    Eyes: Negative.   Respiratory: Negative.    Cardiovascular: Negative.   Gastrointestinal: Negative.   Endocrine: Negative.   Genitourinary: Negative.   Skin: Negative.   Allergic/Immunologic: Negative.   Neurological: Negative.   Hematological: Negative.   Psychiatric/Behavioral: Negative.    All other systems reviewed and are negative.    Objective: Vital Signs: There were no vitals taken for this visit.  Physical Exam Vitals and nursing  note reviewed.  Constitutional:      Appearance: He is well-developed.  HENT:     Head: Normocephalic and atraumatic.  Eyes:     Pupils: Pupils are equal, round, and reactive to light.  Pulmonary:     Effort: Pulmonary effort is normal.  Abdominal:     Palpations: Abdomen is soft.  Musculoskeletal:        General: Normal range of motion.     Cervical back: Neck supple.  Skin:    General: Skin is warm.  Neurological:     Mental Status: He is alert and oriented to person, place, and time.  Psychiatric:        Behavior: Behavior normal.        Thought Content: Thought content normal.        Judgment: Judgment normal.     Ortho Exam  Specialty Comments:  No specialty comments available.  Imaging: No results found.   PMFS History: Patient Active Problem List   Diagnosis Date Noted   Primary osteoarthritis, right shoulder 01/30/2024   Neck pain 01/30/2024   Pain due to onychomycosis of toenails of both feet 01/11/2022   Pain in left ankle and joints of left foot 10/28/2021   S/P right hemicolectomy 09/16/2021   Colon cancer (HCC) 08/15/2021   Type 2 diabetes mellitus without complication, without long-term current use of insulin (HCC) 02/04/2020  OAG (open angle glaucoma) suspect, high risk, right 05/10/2018   Primary open angle glaucoma (POAG) of left eye, moderate stage 05/10/2018   Steroid responders to glaucoma of left eye 05/10/2018   History of radial keratotomy 05/27/2014   Other retinal detachments 05/27/2014   PCO (posterior capsular opacification), right 05/27/2014   Pseudophakia of both eyes 05/27/2014   Past Medical History:  Diagnosis Date   Anxiety    Cancer (HCC)    colon cancer, skin cancer   Depression    Hyperlipidemia    Hypertension    Pre-diabetes    Sleep apnea    cpap    No family history on file.  Past Surgical History:  Procedure Laterality Date   bicep surgery Bilateral    CHOLECYSTECTOMY     COLON SURGERY     EYE SURGERY      KNEE SURGERY Left    LAPAROSCOPIC RIGHT HEMI COLECTOMY Right 09/16/2021   Procedure: LAPAROSCOPIC EXTENDED RIGHT HEMI COLECTOMY;  Surgeon: Teresa Lonni HERO, MD;  Location: [facility];  Service: General;  Laterality: Right;   MASS EXCISION Left 09/16/2021   Procedure: EXCISION MASS LEFT CHEST WALL;  Surgeon: Teresa Lonni HERO, MD;  Location: [facility];  Service: General;  Laterality: Left;   SHOULDER SURGERY Left    Social History   Occupational History   Not on file  Tobacco Use   Smoking status: Former    Types: Cigarettes, Cigars   Smokeless tobacco: Never  Vaping Use   Vaping status: Never Used  Substance and Sexual Activity   Alcohol use: Yes    Alcohol/week: 9.0 standard drinks of alcohol    Types: 6 Cans of beer, 3 Shots of liquor per week    Comment: social   Drug use: No   Sexual activity: Not on file        "

## 2024-09-22 ENCOUNTER — Inpatient Hospital Stay

## 2024-10-02 ENCOUNTER — Inpatient Hospital Stay: Admitting: Oncology
